# Patient Record
Sex: Male | Born: 1975 | Race: White | Hispanic: No | Marital: Married | Smoking: Never smoker
Health system: Southern US, Community
[De-identification: ages and names within clinical notes are randomized; demographics above are authoritative.]

## PROBLEM LIST (undated history)

## (undated) DIAGNOSIS — B019 Varicella without complication: Secondary | ICD-10-CM

## (undated) DIAGNOSIS — H00013 Hordeolum externum right eye, unspecified eyelid: Secondary | ICD-10-CM

## (undated) DIAGNOSIS — I7300 Raynaud's syndrome without gangrene: Secondary | ICD-10-CM

## (undated) DIAGNOSIS — E785 Hyperlipidemia, unspecified: Secondary | ICD-10-CM

## (undated) DIAGNOSIS — T7840XA Allergy, unspecified, initial encounter: Secondary | ICD-10-CM

## (undated) HISTORY — DX: Raynaud's syndrome without gangrene: I73.00

## (undated) HISTORY — DX: Varicella without complication: B01.9

## (undated) HISTORY — DX: Hordeolum externum right eye, unspecified eyelid: H00.013

## (undated) HISTORY — PX: WISDOM TOOTH EXTRACTION: SHX21

## (undated) HISTORY — DX: Hyperlipidemia, unspecified: E78.5

## (undated) HISTORY — DX: Allergy, unspecified, initial encounter: T78.40XA

---

## 2009-04-11 HISTORY — PX: VASECTOMY: SHX75

## 2015-07-13 DIAGNOSIS — M19012 Primary osteoarthritis, left shoulder: Secondary | ICD-10-CM | POA: Diagnosis not present

## 2015-07-13 DIAGNOSIS — M7542 Impingement syndrome of left shoulder: Secondary | ICD-10-CM | POA: Diagnosis not present

## 2015-07-13 DIAGNOSIS — M25512 Pain in left shoulder: Secondary | ICD-10-CM | POA: Diagnosis not present

## 2015-08-28 DIAGNOSIS — L720 Epidermal cyst: Secondary | ICD-10-CM | POA: Diagnosis not present

## 2015-09-15 DIAGNOSIS — D485 Neoplasm of uncertain behavior of skin: Secondary | ICD-10-CM | POA: Diagnosis not present

## 2015-09-15 DIAGNOSIS — L720 Epidermal cyst: Secondary | ICD-10-CM | POA: Diagnosis not present

## 2015-12-29 ENCOUNTER — Encounter: Payer: Self-pay | Admitting: Family Medicine

## 2015-12-29 ENCOUNTER — Ambulatory Visit (INDEPENDENT_AMBULATORY_CARE_PROVIDER_SITE_OTHER): Payer: BLUE CROSS/BLUE SHIELD | Admitting: Family Medicine

## 2015-12-29 ENCOUNTER — Telehealth: Payer: Self-pay | Admitting: Family Medicine

## 2015-12-29 VITALS — BP 138/88 | HR 61 | Temp 97.8°F | Resp 20 | Ht 71.0 in | Wt 175.0 lb

## 2015-12-29 DIAGNOSIS — Z130 Encounter for screening for diseases of the blood and blood-forming organs and certain disorders involving the immune mechanism: Secondary | ICD-10-CM | POA: Diagnosis not present

## 2015-12-29 DIAGNOSIS — E785 Hyperlipidemia, unspecified: Secondary | ICD-10-CM | POA: Insufficient documentation

## 2015-12-29 DIAGNOSIS — Z131 Encounter for screening for diabetes mellitus: Secondary | ICD-10-CM | POA: Diagnosis not present

## 2015-12-29 DIAGNOSIS — Z0000 Encounter for general adult medical examination without abnormal findings: Secondary | ICD-10-CM | POA: Diagnosis not present

## 2015-12-29 DIAGNOSIS — Z7189 Other specified counseling: Secondary | ICD-10-CM | POA: Diagnosis not present

## 2015-12-29 DIAGNOSIS — Z13220 Encounter for screening for lipoid disorders: Secondary | ICD-10-CM

## 2015-12-29 DIAGNOSIS — Z7689 Persons encountering health services in other specified circumstances: Secondary | ICD-10-CM

## 2015-12-29 LAB — COMPREHENSIVE METABOLIC PANEL WITH GFR
ALT: 18 U/L (ref 0–53)
AST: 16 U/L (ref 0–37)
Albumin: 4.4 g/dL (ref 3.5–5.2)
Alkaline Phosphatase: 44 U/L (ref 39–117)
BUN: 11 mg/dL (ref 6–23)
CO2: 32 meq/L (ref 19–32)
Calcium: 9.3 mg/dL (ref 8.4–10.5)
Chloride: 103 meq/L (ref 96–112)
Creatinine, Ser: 0.83 mg/dL (ref 0.40–1.50)
GFR: 108.57 mL/min
Glucose, Bld: 85 mg/dL (ref 70–99)
Potassium: 4.4 meq/L (ref 3.5–5.1)
Sodium: 140 meq/L (ref 135–145)
Total Bilirubin: 0.7 mg/dL (ref 0.2–1.2)
Total Protein: 7.2 g/dL (ref 6.0–8.3)

## 2015-12-29 LAB — LIPID PANEL
Cholesterol: 219 mg/dL — ABNORMAL HIGH (ref 0–200)
HDL: 56 mg/dL
LDL Cholesterol: 152 mg/dL — ABNORMAL HIGH (ref 0–99)
NonHDL: 162.85
Total CHOL/HDL Ratio: 4
Triglycerides: 55 mg/dL (ref 0.0–149.0)
VLDL: 11 mg/dL (ref 0.0–40.0)

## 2015-12-29 LAB — CBC WITH DIFFERENTIAL/PLATELET
BASOS PCT: 0.6 % (ref 0.0–3.0)
Basophils Absolute: 0 10*3/uL (ref 0.0–0.1)
EOS ABS: 0.1 10*3/uL (ref 0.0–0.7)
Eosinophils Relative: 1.6 % (ref 0.0–5.0)
HEMATOCRIT: 44.6 % (ref 39.0–52.0)
Hemoglobin: 15.2 g/dL (ref 13.0–17.0)
LYMPHS ABS: 1.7 10*3/uL (ref 0.7–4.0)
Lymphocytes Relative: 28.4 % (ref 12.0–46.0)
MCHC: 34 g/dL (ref 30.0–36.0)
MCV: 94.9 fl (ref 78.0–100.0)
MONO ABS: 0.5 10*3/uL (ref 0.1–1.0)
Monocytes Relative: 8.8 % (ref 3.0–12.0)
NEUTROS ABS: 3.7 10*3/uL (ref 1.4–7.7)
Neutrophils Relative %: 60.6 % (ref 43.0–77.0)
PLATELETS: 241 10*3/uL (ref 150.0–400.0)
RBC: 4.7 Mil/uL (ref 4.22–5.81)
RDW: 12.5 % (ref 11.5–15.5)
WBC: 6.1 10*3/uL (ref 4.0–10.5)

## 2015-12-29 LAB — HEMOGLOBIN A1C: Hgb A1c MFr Bld: 5.3 % (ref 4.6–6.5)

## 2015-12-29 NOTE — Progress Notes (Signed)
Patient ID: Craig Burton, male  DOB: [birth year], 40 y.o.   MRN: [id number] Patient Care Team    Relationship Specialty Notifications Start End  Ma Hillock, DO PCP - General Family Medicine  12/29/15     Subjective:  Craig Burton is a 40 y.o.  male present for new patient establishment. All past medical history, surgical history, allergies, family history, immunizations, medications and social history were obtained in the electronic medical record today. All recent labs, ED visits and hospitalizations within the last year were reviewed.  Health maintenance:  Colonoscopy: No fhx, screen at 49 Immunizations: tdap 2013, Influenza declined (encouraged yearly) Infectious disease screening: unknown, awaiting records.  PSA: No results found for: PSA . No Fhx. Screening discussion at 50-55.  Assistive device: no Oxygen use: no  Patient has a Dental home. Hospitalizations/ED visits: No Last Eye exam: [facility] [place]. 2016. Fhx glaucoma.  Immunization History  Administered Date(s) Administered  . Tdap 04/12/2011     Past Medical History:  Diagnosis Date  . Allergy   . Chicken pox    Allergies  Allergen Reactions  . Penicillins Hives   Past Surgical History:  Procedure Laterality Date  . VASECTOMY  2011   Family History  Problem Relation Age of Onset  . Arthritis Mother   . Diabetes Mother   . Prostate cancer Father   . Heart disease Father     MI- 69  . Glaucoma Father   . Arthritis Maternal Grandmother   . Diabetes Maternal Grandmother   . Stroke Maternal Grandmother     90  . Glaucoma Maternal Grandmother   . Diabetes Maternal Grandfather    Social History   Social History  . Marital status: Married    Spouse name: N/A  . Number of children: N/A  . Years of education: N/A   Occupational History  . Not on file.   Social History Main Topics  . Smoking status: Never Smoker  . Smokeless tobacco: Never Used  . Alcohol use 4.2 oz/week    7  Cans of beer per week     Comment: daily 1-2 drinks   . Drug use: No  . Sexual activity: Yes    Birth control/ protection: None   Other Topics Concern  . Not on file   Social History Narrative   Married to [place]). 2 Children (Will and [name]).    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    Feels safe in his relationships.      Medication List    as of 12/29/2015  5:16 PM   You have not been prescribed any medications.      Recent Results (from the past 2160 hour(s))  CBC w/Diff     Status: None   Collection Time: 12/29/15  9:52 AM  Result Value Ref Range   WBC 6.1 4.0 - 10.5 K/uL   RBC 4.70 4.22 - 5.81 Mil/uL   Hemoglobin 15.2 13.0 - 17.0 g/dL   HCT 44.6 39.0 - 52.0 %   MCV 94.9 78.0 - 100.0 fl   MCHC 34.0 30.0 - 36.0 g/dL   RDW 12.5 11.5 - 15.5 %   Platelets 241.0 150.0 - 400.0 K/uL   Neutrophils Relative % 60.6 43.0 - 77.0 %   Lymphocytes Relative 28.4 12.0 - 46.0 %   Monocytes Relative 8.8 3.0 - 12.0 %  Eosinophils Relative 1.6 0.0 - 5.0 %   Basophils Relative 0.6 0.0 - 3.0 %   Neutro Abs 3.7 1.4 - 7.7 K/uL   Lymphs Abs 1.7 0.7 - 4.0 K/uL   Monocytes Absolute 0.5 0.1 - 1.0 K/uL   Eosinophils Absolute 0.1 0.0 - 0.7 K/uL   Basophils Absolute 0.0 0.0 - 0.1 K/uL  Comp Met (CMET)     Status: None   Collection Time: 12/29/15  9:52 AM  Result Value Ref Range   Sodium 140 135 - 145 mEq/L   Potassium 4.4 3.5 - 5.1 mEq/L   Chloride 103 96 - 112 mEq/L   CO2 32 19 - 32 mEq/L   Glucose, Bld 85 70 - 99 mg/dL   BUN 11 6 - 23 mg/dL   Creatinine, Ser 0.83 0.40 - 1.50 mg/dL   Total Bilirubin 0.7 0.2 - 1.2 mg/dL   Alkaline Phosphatase 44 39 - 117 U/L   AST 16 0 - 37 U/L   ALT 18 0 - 53 U/L   Total Protein 7.2 6.0 - 8.3 g/dL   Albumin 4.4 3.5 - 5.2 g/dL   Calcium 9.3 8.4 - 10.5 mg/dL   GFR 108.57 >60.00 mL/min  HgB A1c     Status: None   Collection Time: 12/29/15  9:52 AM  Result  Value Ref Range   Hgb A1c MFr Bld 5.3 4.6 - 6.5 %    Comment: Glycemic Control Guidelines for People with Diabetes:Non Diabetic:  <6%Goal of Therapy: <7%Additional Action Suggested:  >8%   Lipid panel     Status: Abnormal   Collection Time: 12/29/15  9:52 AM  Result Value Ref Range   Cholesterol 219 (H) 0 - 200 mg/dL    Comment: ATP III Classification       Desirable:  < 200 mg/dL               Borderline High:  200 - 239 mg/dL          High:  > = 240 mg/dL   Triglycerides 55.0 0.0 - 149.0 mg/dL    Comment: Normal:  <150 mg/dLBorderline High:  150 - 199 mg/dL   HDL 56.00 >39.00 mg/dL   VLDL 11.0 0.0 - 40.0 mg/dL   LDL Cholesterol 152 (H) 0 - 99 mg/dL   Total CHOL/HDL Ratio 4     Comment:                Men          Women1/2 Average Risk     3.4          3.3Average Risk          5.0          4.42X Average Risk          9.6          7.13X Average Risk          15.0          11.0                       NonHDL 162.85     Comment: NOTE:  Non-HDL goal should be 30 mg/dL higher than patient's LDL goal (i.e. LDL goal of < 70 mg/dL, would have non-HDL goal of < 100 mg/dL)    Patient was never admitted.   ROS: 14 pt review of systems performed and negative (unless mentioned in an HPI)  Objective: BP 138/88 (BP Location: Right  Arm, Patient Position: Sitting, Cuff Size: Normal)   Pulse 61   Temp 97.8 F (36.6 C)   Resp 20   Ht _0  (1.803 m)   Wt 175 lb (79.4 kg)   SpO2 98%   BMI 24.41 kg/m  Gen: Afebrile. No acute distress. Nontoxic in appearance, well-developed, well-nourished,  Thin caucasian male.  HENT: AT. [place]. Bilateral TM visualized and normal in appearance, normal external auditory canal. MMM, no oral lesions, adequate dentition. Bilateral nares within normal limits. Throat without erythema, ulcerations or exudates. no Cough on exam, no hoarseness on exam. Eyes:Pupils Equal Round Reactive to light, Extraocular movements intact,  Conjunctiva without redness, discharge or  icterus. Neck/lymp/endocrine: Supple,no lymphadenopathy, no thyromegaly CV: RRR no murmur, no edema, +2/4 P posterior tibialis pulses.  Chest: CTAB, no wheeze, rhonchi or crackles. normal Respiratory effort. normal Air movement. Abd: Soft. falt. NTND. BS present. no Masses palpated. No hepatosplenomegaly. No rebound tenderness or guarding. Skin: no rashes, purpura or petechiae. Warm and well-perfused. Skin intact. Neuro/Msk:  Normal gait. PERLA. EOMi. Alert. Oriented x3.  Cranial nerves II through XII intact. Muscle strength 5/5 upper/lower extremity. DTRs equal bilaterally. Psych: Normal affect, dress and demeanor. Normal speech. Normal thought content and judgment.   Assessment/plan: Sultan Pargas is a 40 y.o. male present for establish with CPE/establishment of care.  Encounter for preventive health examination Patient was encouraged to exercise greater than 150 minutes a week. Patient was encouraged to choose a diet filled with fresh fruits and vegetables, and lean meats. AVS provided to patient today for education/recommendation on gender specific health and safety maintenance. Influenza declined today.  Tdap UTD. Elevated lipid/Screening cholesterol level - Comp Met (CMET) - Lipid panel Screening, anemia, deficiency, iron - CBC w/Diff Diabetes mellitus screening - HgB A1c - Lipid panel   Return in about 1 year (around 12/28/2016) for CPE.  Electronically signed by: Howard Pouch, DO [place]

## 2015-12-29 NOTE — Patient Instructions (Signed)

## 2015-12-29 NOTE — Telephone Encounter (Signed)
Please call pt: - his labs looked all normal with the exception of elevated cholesterol.  - with his fhx, I would suggest he start fish oil ~ 1- 2 g daily, eat a diet low in fructose/sugar and higher fiber and exercise > 150 minutes a week. We will monitor yearly.   Lipid Panel     Component Value Date/Time   CHOL 219 (H) 12/29/2015 0952   TRIG 55.0 12/29/2015 0952   HDL 56.00 12/29/2015 0952   CHOLHDL 4 12/29/2015 0952   VLDL 11.0 12/29/2015 0952   LDLCALC 152 (H) 12/29/2015 [id number]

## 2015-12-31 NOTE — Telephone Encounter (Signed)
Left detailed message with lab results and instructions on patient voice mail.

## 2016-01-18 ENCOUNTER — Encounter: Payer: Self-pay | Admitting: Family Medicine

## 2016-03-09 DIAGNOSIS — H5203 Hypermetropia, bilateral: Secondary | ICD-10-CM | POA: Diagnosis not present

## 2016-03-09 DIAGNOSIS — Z135 Encounter for screening for eye and ear disorders: Secondary | ICD-10-CM | POA: Diagnosis not present

## 2016-03-09 DIAGNOSIS — H52223 Regular astigmatism, bilateral: Secondary | ICD-10-CM | POA: Diagnosis not present

## 2016-04-11 DIAGNOSIS — I7300 Raynaud's syndrome without gangrene: Secondary | ICD-10-CM

## 2016-04-11 HISTORY — DX: Raynaud's syndrome without gangrene: I73.00

## 2016-07-05 ENCOUNTER — Other Ambulatory Visit: Payer: Self-pay | Admitting: Emergency Medicine

## 2016-07-05 ENCOUNTER — Ambulatory Visit (INDEPENDENT_AMBULATORY_CARE_PROVIDER_SITE_OTHER): Payer: BLUE CROSS/BLUE SHIELD | Admitting: Family Medicine

## 2016-07-05 ENCOUNTER — Ambulatory Visit (INDEPENDENT_AMBULATORY_CARE_PROVIDER_SITE_OTHER): Payer: Self-pay

## 2016-07-05 ENCOUNTER — Encounter: Payer: Self-pay | Admitting: Family Medicine

## 2016-07-05 ENCOUNTER — Ambulatory Visit: Payer: BLUE CROSS/BLUE SHIELD | Admitting: Family Medicine

## 2016-07-05 VITALS — BP 128/74 | HR 75 | Temp 98.0°F | Ht 71.0 in | Wt 170.8 lb

## 2016-07-05 DIAGNOSIS — R52 Pain, unspecified: Secondary | ICD-10-CM

## 2016-07-05 DIAGNOSIS — J069 Acute upper respiratory infection, unspecified: Secondary | ICD-10-CM

## 2016-07-05 DIAGNOSIS — B9789 Other viral agents as the cause of diseases classified elsewhere: Secondary | ICD-10-CM

## 2016-07-05 DIAGNOSIS — W57XXXA Bitten or stung by nonvenomous insect and other nonvenomous arthropods, initial encounter: Secondary | ICD-10-CM | POA: Diagnosis not present

## 2016-07-05 DIAGNOSIS — S70362A Insect bite (nonvenomous), left thigh, initial encounter: Secondary | ICD-10-CM | POA: Diagnosis not present

## 2016-07-05 DIAGNOSIS — M25532 Pain in left wrist: Secondary | ICD-10-CM

## 2016-07-05 MED ORDER — DOXYCYCLINE HYCLATE 100 MG PO TABS: 100.0000 mg | ORAL_TABLET | Freq: Two times a day (BID) | ORAL | 0 refills | Status: AC

## 2016-07-05 NOTE — Progress Notes (Signed)
Pre visit review using our clinic review tool, if applicable. No additional management support is needed unless otherwise documented below in the visit note. 

## 2016-07-05 NOTE — Progress Notes (Signed)
Shamus Desantis is a 41 y.o. male here for a new problem.  History of Present Illness:   Insurance claims handler, CMA, acting as scribe for Dr. Earlene Plater.  Chief Complaint  Patient presents with  . Acute Visit  . Tick Removal   HPI: Patient states he went on a field trip over the weekend with his daughter's school.  They stayed in [place]. On Sunday, he found a tick on his upper, inner left thigh.  He removed the tick fully.  The area is now red and swollen.  Not painful but does itch.  He states he had a low-grade fever on Monday night and felt fatigued as well.  States he also thinks he is developing a cold because he has had a dry cough and sinus-type headache. He has taken Tylenol for treatment with mild relief.   PMHx, SurgHx, SocialHx, Medications, and Allergies were reviewed in the Visit Navigator and updated as appropriate.  Current Medications:   .  None  Review of Systems:   Review of Systems  Constitutional: Positive for chills, fever and malaise/fatigue.  HENT: Negative for congestion.   Eyes: Negative for blurred vision.  Respiratory: Positive for cough. Negative for shortness of breath.   Cardiovascular: Negative for chest pain and palpitations.  Gastrointestinal: Negative for abdominal pain, nausea and vomiting.  Genitourinary: Negative for frequency.  Musculoskeletal: Negative for back pain.  Skin: Positive for itching.  Neurological: Positive for headaches. Negative for loss of consciousness.    Vitals:   Vitals:   07/05/16 1313  BP: 128/74  Pulse: 75  Temp: 98 F (36.7 C)  TempSrc: Oral  SpO2: 98%  Weight: 170 lb 12.8 oz (77.5 kg)  Height: 5\' 11"  (1.803 m)     Body mass index is 23.82 kg/m.  Physical Exam:   Physical Exam  Constitutional: He is oriented to person, place, and time. He appears well-developed and well-nourished. No distress.  HENT:  Head: Normocephalic and atraumatic.  Right Ear: External ear normal.  Left Ear: External ear normal.  Nose: Nose  normal.  Mouth/Throat: Oropharynx is clear and moist.  Eyes: Conjunctivae and EOM are normal. Pupils are equal, round, and reactive to light.  Neck: Normal range of motion. Neck supple.  Cardiovascular: Normal rate, regular rhythm, normal heart sounds and intact distal pulses.   Pulmonary/Chest: Effort normal and breath sounds normal.  Abdominal: Soft. Bowel sounds are normal.  Musculoskeletal: Normal range of motion.  Neurological: He is alert and oriented to person, place, and time.  Skin:     Psychiatric: He has a normal mood and affect. His behavior is normal. Judgment and thought content normal.  Nursing note and vitals reviewed.   Assessment and Plan:    Marquis was seen today for acute visit and tick removal.  Diagnoses and all orders for this visit:  Tick bite of left thigh, initial encounter Comments: Unlikely to be the cause of RMSF. Not concerned for Lyme. Rx Abx safety net.  Orders: -     doxycycline (VIBRA-TABS) 100 MG tablet; Take 1 tablet (100 mg total) by mouth 2 (two) times daily.  Viral upper respiratory tract infection Comments: Symptomatic care reviewed.   . Reviewed expectations re: course of current medical issues. . Discussed self-management of symptoms. . Outlined signs and symptoms indicating need for more acute intervention. . Patient verbalized understanding and all questions were answered. . See orders for this visit as documented in the electronic medical record. . Patient received an After-Visit Summary.  CMA  served as Neurosurgeonscribe during this visit. History, Physical, and Plan performed by medical provider. Documentation and orders reviewed and attested to. Helane RimaErica Dalaysia Harms, D.O.  Helane RimaErica Sofi Bryars, D.O.

## 2016-12-30 ENCOUNTER — Encounter: Payer: BLUE CROSS/BLUE SHIELD | Admitting: Family Medicine

## 2017-01-06 ENCOUNTER — Encounter: Payer: Self-pay | Admitting: Family Medicine

## 2017-01-06 ENCOUNTER — Ambulatory Visit (INDEPENDENT_AMBULATORY_CARE_PROVIDER_SITE_OTHER): Payer: BLUE CROSS/BLUE SHIELD | Admitting: Family Medicine

## 2017-01-06 VITALS — BP 125/86 | HR 60 | Temp 98.0°F | Resp 20 | Ht 71.0 in | Wt 172.0 lb

## 2017-01-06 DIAGNOSIS — E785 Hyperlipidemia, unspecified: Secondary | ICD-10-CM

## 2017-01-06 DIAGNOSIS — Z0000 Encounter for general adult medical examination without abnormal findings: Secondary | ICD-10-CM

## 2017-01-06 DIAGNOSIS — Z131 Encounter for screening for diabetes mellitus: Secondary | ICD-10-CM | POA: Diagnosis not present

## 2017-01-06 DIAGNOSIS — Z130 Encounter for screening for diseases of the blood and blood-forming organs and certain disorders involving the immune mechanism: Secondary | ICD-10-CM

## 2017-01-06 DIAGNOSIS — Z2821 Immunization not carried out because of patient refusal: Secondary | ICD-10-CM | POA: Diagnosis not present

## 2017-01-06 LAB — COMPREHENSIVE METABOLIC PANEL
ALT: 18 U/L (ref 0–53)
AST: 16 U/L (ref 0–37)
Albumin: 4.6 g/dL (ref 3.5–5.2)
Alkaline Phosphatase: 45 U/L (ref 39–117)
BUN: 13 mg/dL (ref 6–23)
CO2: 32 meq/L (ref 19–32)
CREATININE: 0.81 mg/dL (ref 0.40–1.50)
Calcium: 9.9 mg/dL (ref 8.4–10.5)
Chloride: 100 mEq/L (ref 96–112)
GFR: 111.11 mL/min (ref >60.00)
GLUCOSE: 90 mg/dL (ref 70–99)
Potassium: 4.8 mEq/L (ref 3.5–5.1)
SODIUM: 137 meq/L (ref 135–145)
TOTAL PROTEIN: 7.2 g/dL (ref 6.0–8.3)
Total Bilirubin: 0.5 mg/dL (ref 0.2–1.2)

## 2017-01-06 LAB — LIPID PANEL
CHOLESTEROL: 216 mg/dL — AB (ref 0–200)
HDL: 52.3 mg/dL (ref >39.00)
LDL Cholesterol: 151 mg/dL — ABNORMAL HIGH (ref 0–99)
NonHDL: 164.06
TRIGLYCERIDES: 65 mg/dL (ref 0.0–149.0)
Total CHOL/HDL Ratio: 4
VLDL: 13 mg/dL (ref 0.0–40.0)

## 2017-01-06 LAB — CBC WITH DIFFERENTIAL/PLATELET
BASOS ABS: 0.1 10*3/uL (ref 0.0–0.1)
BASOS PCT: 1.2 % (ref 0.0–3.0)
EOS ABS: 0.1 10*3/uL (ref 0.0–0.7)
Eosinophils Relative: 1.5 % (ref 0.0–5.0)
HCT: 46.8 % (ref 39.0–52.0)
Hemoglobin: 15.2 g/dL (ref 13.0–17.0)
Lymphocytes Relative: 35.6 % (ref 12.0–46.0)
Lymphs Abs: 1.8 10*3/uL (ref 0.7–4.0)
MCHC: 32.6 g/dL (ref 30.0–36.0)
MCV: 98.2 fl (ref 78.0–100.0)
MONO ABS: 0.5 10*3/uL (ref 0.1–1.0)
Monocytes Relative: 9.1 % (ref 3.0–12.0)
NEUTROS ABS: 2.7 10*3/uL (ref 1.4–7.7)
Neutrophils Relative %: 52.6 % (ref 43.0–77.0)
PLATELETS: 271 10*3/uL (ref 150.0–400.0)
RBC: 4.77 Mil/uL (ref 4.22–5.81)
RDW: 13.1 % (ref 11.5–15.5)
WBC: 5.1 10*3/uL (ref 4.0–10.5)

## 2017-01-06 LAB — HEMOGLOBIN A1C: Hgb A1c MFr Bld: 5.1 % (ref 4.6–6.5)

## 2017-01-06 NOTE — Progress Notes (Signed)
Patient ID: Craig Burton, male  DOB: Dec 26, 1975, 41 y.o.   MRN: [id number] Patient Care Team    Relationship Specialty Notifications Start End  Ma Hillock, DO PCP - General Family Medicine  12/29/15     Subjective:  Craig Burton is a 41 y.o.  male present for new patient establishment. All past medical history, surgical history, allergies, family history, immunizations, medications and social history were obtained in the electronic medical record today. All recent labs, ED visits and hospitalizations within the last year were reviewed.  Health maintenance: Updated 01/06/2017 Colonoscopy: No fhx, screen at 44 Immunizations: tdap 2013, Influenza declined (encouraged yearly) Infectious disease screening: declined today. PSA: No results found for: PSA . No Fhx. Screening discussion at 50-55.  Assistive device: no Oxygen use: no Patient has a Dental home. Routine visits Hospitalizations/ED visits: no Last Eye exam: [facility] in [place]. Fhx glaucoma.  Immunization History  Administered Date(s) Administered  . Tdap 04/12/2011     Past Medical History:  Diagnosis Date  . Allergy   . Chicken pox   . Hordeolum externum of right eye   . Hyperlipidemia    Allergies  Allergen Reactions  . Penicillins Hives   Past Surgical History:  Procedure Laterality Date  . VASECTOMY  2011  . WISDOM TOOTH EXTRACTION     Family History  Problem Relation Age of Onset  . Arthritis Mother   . Diabetes Mother   . Prostate cancer Father   . Heart disease Father        MI- 58  . Glaucoma Father   . Arthritis Maternal Grandmother   . Diabetes Maternal Grandmother   . Stroke Maternal Grandmother        90  . Glaucoma Maternal Grandmother   . Diabetes Maternal Grandfather    Social History   Social History  . Marital status: Married    Spouse name: N/A  . Number of children: N/A  . Years of education: N/A   Occupational History  . Not on file.   Social History Main  Topics  . Smoking status: Never Smoker  . Smokeless tobacco: Never Used  . Alcohol use 4.2 oz/week    7 Cans of beer per week     Comment: daily 1-2 drinks   . Drug use: No  . Sexual activity: Yes    Birth control/ protection: None   Other Topics Concern  . Not on file   Social History Narrative   Married to [place]). 2 Children (Will and [name]).    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    Feels safe in his relationships.    Allergies as of 01/06/2017      Reactions   Penicillins Hives      Medication List    as of 01/06/2017  9:02 AM   You have not been prescribed any medications.          Discharge Care Instructions        Start     Ordered   01/06/17 0000  Comp Met (CMET)     01/06/17 0854   01/06/17 0000  CBC w/Diff     01/06/17 0854   01/06/17 0000  Lipid panel     01/06/17 0854   01/06/17 0000  HgB A1c     01/06/17 0854       No  results found for this or any previous visit (from the past 2160 hour(s)).  Patient was never admitted.   ROS: 14 pt review of systems performed and negative (unless mentioned in an HPI)  Objective: BP 125/86 (BP Location: Left Arm, Patient Position: Sitting, Cuff Size: Large)   Pulse 60   Temp 98 F (36.7 C)   Resp 20   Ht '5\' 11"'$  (1.803 m)   Wt 172 lb (78 kg)   SpO2 98%   BMI 23.99 kg/m  Gen: Afebrile. No acute distress. Nontoxic in appearance, well developed, well nourished, pleasant caucasian male. HENT: AT. [place]. Bilateral TM visualized and normal in appearance. MMM. Bilateral nares without erythema or swelling. Throat without erythema or exudates. No cough, no hoarseness.  Eyes:Pupils Equal Round Reactive to light, Extraocular movements intact,  Conjunctiva without redness, discharge or icterus. Neck/lymp/endocrine: Supple,no lymphadenopathy, no thyromegaly CV: RRR no murmur, no edema, +2/4 P posterior tibialis  pulses Chest: CTAB, no wheeze or crackles Abd: Soft. flat. NTND. BS present. no Masses palpated.  MSK: no erythema, swelling or deformities. NROM. NV intact. Skin: no rashes, purpura or petechiae.  Neuro:  Normal gait. PERLA. EOMi. Alert. Oriented x3. Cranial nerves II through XII intact. Muscle strength 5/5 U/Lextremity. DTRs equal bilaterally. Psych: Normal affect, dress and demeanor. Normal speech. Normal thought content and judgment.  Assessment/plan: Craig Burton is a 41 y.o. male present for establish with CPE/establishment of care.  Encounter for preventive health examination Patient was encouraged to exercise greater than 150 minutes a week. Patient was encouraged to choose a diet filled with fresh fruits and vegetables, and lean meats. AVS provided to patient today for education/recommendation on gender specific health and safety maintenance. Declined flu, other immunizations UTD.  Declined HIV testing.  Elevated lipids - Recommended low saturated fat, higher fiber diet. Plenty of exercise. Start  Fish oil (dose will be discussed after lab results) - Fhx heart disease is present - Comp Met (CMET) - Lipid panel Screening for diabetes mellitus - HgB A1c Screening for iron deficiency anemia - CBC immunization not carried out because of patient refusal - declined flu shot.    Return in about 1 year (around 01/06/2018) for CPE.  Electronically signed by: Howard Pouch, DO [place]

## 2017-01-06 NOTE — Patient Instructions (Signed)

## 2017-03-22 ENCOUNTER — Telehealth: Payer: Self-pay | Admitting: Medical

## 2017-03-22 ENCOUNTER — Ambulatory Visit (HOSPITAL_BASED_OUTPATIENT_CLINIC_OR_DEPARTMENT_OTHER)
Admission: RE | Admit: 2017-03-22 | Discharge: 2017-03-22 | Disposition: A | Discharge status: Home / Self Care | Payer: BLUE CROSS/BLUE SHIELD | Source: Ambulatory Visit | Attending: Medical | Admitting: Medical

## 2017-03-22 ENCOUNTER — Encounter: Payer: Self-pay | Admitting: Medical

## 2017-03-22 ENCOUNTER — Ambulatory Visit: Payer: BLUE CROSS/BLUE SHIELD | Admitting: Medical

## 2017-03-22 VITALS — BP 117/68 | HR 67 | Temp 97.8°F | Ht 71.0 in | Wt 165.0 lb

## 2017-03-22 DIAGNOSIS — M25522 Pain in left elbow: Secondary | ICD-10-CM | POA: Diagnosis not present

## 2017-03-22 DIAGNOSIS — W19XXXA Unspecified fall, initial encounter: Secondary | ICD-10-CM | POA: Insufficient documentation

## 2017-03-22 DIAGNOSIS — S2242XA Multiple fractures of ribs, left side, initial encounter for closed fracture: Secondary | ICD-10-CM

## 2017-03-22 DIAGNOSIS — R0781 Pleurodynia: Secondary | ICD-10-CM | POA: Diagnosis not present

## 2017-03-22 DIAGNOSIS — J939 Pneumothorax, unspecified: Secondary | ICD-10-CM | POA: Insufficient documentation

## 2017-03-22 DIAGNOSIS — S270XXA Traumatic pneumothorax, initial encounter: Secondary | ICD-10-CM

## 2017-03-22 MED ORDER — KETOROLAC TROMETHAMINE 60 MG/2ML IM SOLN
60.0000 mg | Freq: Once | INTRAMUSCULAR | Status: AC
Start: 2017-03-22 — End: 2017-03-22
  Administered 2017-03-22: 60 mg via INTRAMUSCULAR

## 2017-03-22 MED ORDER — HYDROCODONE-ACETAMINOPHEN 5-325 MG PO TABS: 1.0000 | ORAL_TABLET | Freq: Four times a day (QID) | ORAL | 0 refills | Status: DC | PRN

## 2017-03-22 MED ORDER — DICLOFENAC SODIUM 75 MG PO TBEC: 75.0000 mg | DELAYED_RELEASE_TABLET | Freq: Two times a day (BID) | ORAL | 0 refills | Status: DC

## 2017-03-22 NOTE — Progress Notes (Signed)
Subjective:    Patient ID: Craig Burton, male    DOB: 1975-08-29, 41 y.o.   MRN: [id number]  HPI  Pt fell on steps inside his home. Pt landed on left side of his body. States he was fell down steps and slid down. Has severe pain on deep breathing and moving.   Some pain in his left elbow. Pt does have pain on range of motion.  No neck pain, no shoulder pain, no humerus pain or forearm pain.  No scapular pain.  No loc at time of fall.  Pt is rt handed.  He does not report any shortness of breath.  He states the ribs at rest.    Review of Systems  Constitutional: Negative for chills, fatigue and fever.  Respiratory: Negative for cough, chest tightness and wheezing.   Cardiovascular: Negative for chest pain and palpitations.  Gastrointestinal: Negative for abdominal pain, blood in stool, constipation, diarrhea and vomiting.  Musculoskeletal: Negative for back pain, gait problem and neck pain.       Rib pain and elbow pain  Skin: Negative for rash.  Neurological: Negative for dizziness, seizures, weakness and light-headedness.  Hematological: Negative for adenopathy. Does not bruise/bleed easily.  Psychiatric/Behavioral: Negative for behavioral problems, confusion, hallucinations, sleep disturbance and suicidal ideas. The patient is not nervous/anxious.    Past Medical History:  Diagnosis Date  . Allergy   . Chicken pox   . Hordeolum externum of right eye   . Hyperlipidemia      Social History   Socioeconomic History  . Marital status: Married    Spouse name: Not on file  . Number of children: Not on file  . Years of education: Not on file  . Highest education level: Not on file  Social Needs  . Financial resource strain: Not on file  . Food insecurity - worry: Not on file  . Food insecurity - inability: Not on file  . Transportation needs - medical: Not on file  . Transportation needs - non-medical: Not on file  Occupational History  . Not on file  Tobacco Use    . Smoking status: Never Smoker  . Smokeless tobacco: Never Used  Substance and Sexual Activity  . Alcohol use: Yes    Alcohol/week: 4.2 oz    Types: 7 Cans of beer per week    Comment: daily 1-2 drinks   . Drug use: No  . Sexual activity: Yes    Birth control/protection: None  Other Topics Concern  . Not on file  Social History Narrative   Married to Craig Burton ([place]). 2 Children (Craig Burton and Craig Burton).    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    Feels safe in his relationships.     Past Surgical History:  Procedure Laterality Date  . VASECTOMY  2011  . WISDOM TOOTH EXTRACTION      Family History  Problem Relation Age of Onset  . Arthritis Mother   . Diabetes Mother   . Prostate cancer Father   . Heart disease Father        MI- [id number]  . Glaucoma Father   . Arthritis Maternal Grandmother   . Diabetes Maternal Grandmother   . Stroke Maternal Grandmother        90  . Glaucoma Maternal Grandmother   . Diabetes Maternal Grandfather     Allergies  Allergen Reactions  . Penicillins Hives  No current outpatient medications on file prior to visit.   No current facility-administered medications on file prior to visit.     BP 117/68   Pulse 67   Temp 97.8 F (36.6 C) (Oral)   Ht 5\' 11"  (1.803 m)   Wt 165 lb (74.8 kg)   BMI 23.01 kg/m       Objective:   Physical Exam  General Mental Status- Alert. General Appearance- Not in acute distress.   Skin General: Color- Normal Color. Moisture- Normal Moisture.  Neck Carotid Arteries- Normal color. Moisture- Normal Moisture. No carotid bruits. No JVD.  No trachea midline. No mid c spine tenderness.   Chest and Lung Exam Auscultation: Breath Sounds:-Normal.  Obvious pain on deep inspiration.  Clear, even and unlabored  Cardiovascular Auscultation:Rythm- Regular. Murmurs & Other Heart Sounds:Auscultation of the heart reveals- No  Murmurs.  Lt shoulder- good rom, no pain on palpation of shoulder. Lt humerus- no pain on palpation of humerus. Left elbow- mild-moderate direct tenderness to palpation. No swelling. Pain on flexion and extension. Left side ribs- upper mid clavicular ribs very tender to mild palpation. Bruise seen. Pain over all ribs at same level  Clavicles- no tenderness to palpation. Left scapula- not tender to palpation.   Neurologic Cranial Nerve exam:- CN III-XII intact(No nystagmus), symmetric smile. Strength:- 5/5 equal and symmetric strength both upper and lower extremities.      Assessment & Plan:  For  left-sided rib pain and left elbow pain we Craig Burton get x-rays of both areas.  We Craig Burton let you know those results as they come in.  Toradol 60 mg IM injection given today.  Can start diclofenac tomorrow around 11 AM.  Prescription of Norco given for breakthrough.(Rx advisement given.)  If rib fracture seen can expect pain to last 6-8 weeks.  We would taper down on the medication given for the pain.  If no fracture seen in ribs but high level pain persists in 1-2 weeks then consider repeat x-ray to see small early fracture not visible today.  For elbow pain, if no fracture seen Craig Burton provide arm sling to use when not working.  But if pain still present in a week would recommend repeat x-ray.   If fracture seen today on elbow Craig Burton refer you to sports medicine or orthopedist.  Follow-up in 7-10 days or as needed.  I did get a call from the radiologist today and he described 2 rib fractures with very small less than 5% pneumothorax.  He recommended watchful waiting with follow-ups.  I did send a message to PCP today notifying her of patient's fall and subsequent injuries.  Craig Burton try carbon copy the imaging results as well.  I did talk with patient today and notified him of the findings.  Patient states that he was doing fine still only having pain when he breathes deep or moved his thorax.  No  shortness of breath.  I advised him would notify his PCP.  We Craig Burton see if he is to follow-up with her or with me.  I explained to him that follow-up with me on Friday might be better as we could repeat chest x-ray if needed.  Also I did explain to him that if he has any shortness of breath then be seen in the emergency department.  Did educate him on watchful waiting approach in light of the small percentage.  Patient expressed understanding    Efstathios Sawin, Ramon DredgeEdward, PA-C

## 2017-03-22 NOTE — Telephone Encounter (Signed)
I did talk with patient and informed him of following up on Friday.  I gave him choice to follow-up with his PCP or myself.  He went ahead and agreed to see me on Friday at 2:15 PM.  That would probably be more convenient for him since we can repeat the x-ray in our building.  Will you go ahead and give him an appointment for 2:15 on Friday.  I asked him to call since I do not know how to schedule people.  But would you go ahead and schedule in case she does not call you.  I do not want his slot to be taken.

## 2017-03-22 NOTE — Patient Instructions (Addendum)
For  left-sided rib pain and left elbow pain we will get x-rays of both areas.  We will let you know those results as they come in.  Toradol 60 mg IM injection given today.  Can start diclofenac tomorrow around 11 AM.  Prescription of Norco given for breakthrough.(Rx advisement given.)  If rib fracture seen can expect pain to last 6-8 weeks.  We would taper down on the medication given for the pain.  If no fracture seen in ribs but high level pain persists in 1-2 weeks then consider repeat x-ray to see small early fracture not visible today.  For elbow pain, if no fracture seen will provide arm sling to use when not working.  But if pain still present in a week would recommend repeat x-ray.   If fracture seen today on elbow will refer you to sports medicine or orthopedist.  Follow-up in 7-10 days or as needed.

## 2017-03-22 NOTE — Telephone Encounter (Signed)
Dr. Claiborne BillingsKuneff,  I saw a patient of yours this morning with history of falling  downstairs and subsequent moderate to severe left rib pain when he breathes and when he moves his thorax.  I did x-rays which showed the below findings.  I did talk with the radiologist in on the phone he estimated less than 5% pneumothorax.  He recommended watchful waiting.  I wanted your advice on how to proceed.  If you would like need to follow-up I could see him on Friday and repeat chest x-ray.  Or would you rather see him.  We do have imaging at the med center so may be more convenient to follow-up with myself.  Patient's oxygen was 97% on exam.  No tracheal deviation and no labored breathing.  So I did not think pulmonology or ED necessary.  But just wanted to make you aware.  Might try to call you shortly in your office as well.  Below is the impression summary.   Thanks,  Catera Hankins, Ramon DredgeEdward, PA-C  Displaced lateral left fifth rib fracture. Nondisplaced left sixth rib fracture. Rather minimal pneumothorax on the left without tension component. No edema or consolidation.

## 2017-03-22 NOTE — Telephone Encounter (Deleted)
Craig Burton,  Wanted you to review  this visit note for patient I saw of yours today.  With her complaints I am beginning with workup for fever/FUO.  I wanted to make you aware of recent changes to her Effexor dose and the fact that she is on tramadol.  If I am not mistaken, typically there is some serotonin syndrome potential?  This change was done by psychiatrist and patient's neurosurgeon.  Patient appears stable presently in terms of her mental status/psychiatric state.  I was wondering if you would be in agreement to lower her Effexor 150 mg daily and continue the tramadol. Thanks, Ramon DredgeEdward

## 2017-03-22 NOTE — Telephone Encounter (Addendum)
Thank you. If patient already has follow up with you that is ok. If he desires to come here, that is ok as well. I would just order xray after his exam. Either way, I agree he should be seen Friday.  Thank you.

## 2017-03-23 ENCOUNTER — Telehealth: Payer: Self-pay | Admitting: Medical

## 2017-03-23 NOTE — Telephone Encounter (Signed)
Craig DredgeEdward, I believe this is incorrect patient.  I responded to your note elsewhere. thanks

## 2017-03-23 NOTE — Telephone Encounter (Signed)
Pt is scheduled with you on Friday 03-24-2017 at 2:15. Done.

## 2017-03-23 NOTE — Telephone Encounter (Signed)
I tried to delete the note portion regarding fever of unknown origin.  This was on the wrong patient's chart.

## 2017-03-23 NOTE — Telephone Encounter (Signed)
Note the documentation in patient chart is incorrect. That was accidentally place in wrong chart.

## 2017-03-23 NOTE — Telephone Encounter (Signed)
Open to review to tried to delete erroneous entry

## 2017-03-24 ENCOUNTER — Ambulatory Visit: Payer: BLUE CROSS/BLUE SHIELD | Admitting: Medical

## 2017-03-24 ENCOUNTER — Encounter: Payer: Self-pay | Admitting: Medical

## 2017-03-24 ENCOUNTER — Ambulatory Visit (HOSPITAL_BASED_OUTPATIENT_CLINIC_OR_DEPARTMENT_OTHER)
Admission: RE | Admit: 2017-03-24 | Discharge: 2017-03-24 | Disposition: A | Discharge status: Home / Self Care | Payer: BLUE CROSS/BLUE SHIELD | Source: Ambulatory Visit | Attending: Medical | Admitting: Medical

## 2017-03-24 VITALS — BP 125/79 | HR 69 | Temp 97.9°F | Resp 16 | Wt 165.2 lb

## 2017-03-24 DIAGNOSIS — R918 Other nonspecific abnormal finding of lung field: Secondary | ICD-10-CM | POA: Diagnosis not present

## 2017-03-24 DIAGNOSIS — S270XXD Traumatic pneumothorax, subsequent encounter: Secondary | ICD-10-CM | POA: Diagnosis not present

## 2017-03-24 DIAGNOSIS — X58XXXA Exposure to other specified factors, initial encounter: Secondary | ICD-10-CM | POA: Insufficient documentation

## 2017-03-24 DIAGNOSIS — S2242XA Multiple fractures of ribs, left side, initial encounter for closed fracture: Secondary | ICD-10-CM

## 2017-03-24 NOTE — Progress Notes (Signed)
Subjective:    Patient ID: Craig Burton, male    DOB: [birth year], 41 y.o.   MRN: [id number]  HPI  Pain is managable. Pt states pain getting in and out of car.  But if he is seated and not moving he really does not have much pain at all.  He can do work remotely from home on his computer.  If he  talks long time will feel mild sob.  But does not describe any labored type breathing.  Pt has not taken pain medication. Only taking nsaid diclofenac   Known pneumothorax small percentage less than 5 % per radiologist.  Left elbow feels fine.   Can breath deeper today with no pain.  Review of Systems  Constitutional: Negative for chills, fatigue and fever.  HENT: Negative for congestion, ear discharge, facial swelling, mouth sores, nosebleeds and postnasal drip.   Respiratory: Negative for cough, chest tightness, shortness of breath and wheezing.        See hpi.  Cardiovascular: Negative for chest pain and palpitations.  Gastrointestinal: Negative for abdominal pain.  Genitourinary: Negative for dysuria.  Musculoskeletal: Negative for back pain.       Rib pain  Skin: Negative for rash.  Neurological: Negative for dizziness, light-headedness and headaches.  Hematological: Negative for adenopathy. Does not bruise/bleed easily.  Psychiatric/Behavioral: Negative for behavioral problems and confusion.   Past Medical History:  Diagnosis Date  . Allergy   . Chicken pox   . Hordeolum externum of right eye   . Hyperlipidemia      Social History   Socioeconomic History  . Marital status: Married    Spouse name: Not on file  . Number of children: Not on file  . Years of education: Not on file  . Highest education level: Not on file  Social Needs  . Financial resource strain: Not on file  . Food insecurity - worry: Not on file  . Food insecurity - inability: Not on file  . Transportation needs - medical: Not on file  . Transportation needs - non-medical: Not on file    Occupational History  . Not on file  Tobacco Use  . Smoking status: Never Smoker  . Smokeless tobacco: Never Used  Substance and Sexual Activity  . Alcohol use: Yes    Alcohol/week: 4.2 oz    Types: 7 Cans of beer per week    Comment: daily 1-2 drinks   . Drug use: No  . Sexual activity: Yes    Birth control/protection: None  Other Topics Concern  . Not on file  Social History Narrative   Married to [place] (Katie). 2 Children (Will and [name]).    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    Feels safe in his relationships.     Past Surgical History:  Procedure Laterality Date  . VASECTOMY  2011  . WISDOM TOOTH EXTRACTION      Family History  Problem Relation Age of Onset  . Arthritis Mother   . Diabetes Mother   . Prostate cancer Father   . Heart disease Father        MI- [id number]  . Glaucoma Father   . Arthritis Maternal Grandmother   . Diabetes Maternal Grandmother   . Stroke Maternal Grandmother        90  . Glaucoma Maternal Grandmother   . Diabetes Maternal Grandfather     Allergies  Allergen Reactions  . Penicillins Hives    Current Outpatient Medications on File Prior to Visit  Medication Sig Dispense Refill  . diclofenac (VOLTAREN) 75 MG EC tablet Take 1 tablet (75 mg total) by mouth 2 (two) times daily. 30 tablet 0  . HYDROcodone-acetaminophen (NORCO) 5-325 MG tablet Take 1 tablet by mouth every 6 (six) hours as needed for moderate pain. 20 tablet 0   No current facility-administered medications on file prior to visit.     BP 125/79   Pulse 69   Temp 97.9 F (36.6 C) (Oral)   Resp 16   Wt 165 lb 3.2 oz (74.9 kg)   SpO2 100%   BMI 23.04 kg/m       Objective:   Physical Exam   General- No acute distress. Pleasant patient. Neck- Full range of motion, no jvd trachea midline. Lungs- Clear, even and unlabored. Heart- regular rate and rhythm. Neurologic- CNII-  XII grossly intact. Left arm- in sling. No pain but when uses has less rib pain.      Assessment & Plan:  Will repeat xray today to assess size of pneumothorax. Make sure stable.  Elbow by your report does not need repeat xray.(I do agree with patient's assessment.)  Continue diclofenac. Use norco if needed for severe pain. So far no need to use.  Follow up date to be determined after xray. Will update Dr. Claiborne BillingsKuneff on your xray finding.  Craig Burton, Craig DredgeEdward, PA-C

## 2017-03-24 NOTE — Patient Instructions (Addendum)
Will repeat xray today to assess size of pneumothorax. Make sure stable.  Elbow by your report does not need repeat xray.(I do agree with patient's assessment)  Continue diclofenac. Use norco if needed for severe pain. So far no need to use.  Follow up date to be determined after xray. Will update Dr. Claiborne BillingsKuneff on your xray finding.

## 2017-04-14 ENCOUNTER — Ambulatory Visit: Payer: Self-pay | Admitting: Family Medicine

## 2017-04-21 ENCOUNTER — Encounter: Payer: Self-pay | Admitting: Family Medicine

## 2017-04-21 ENCOUNTER — Ambulatory Visit: Payer: BLUE CROSS/BLUE SHIELD | Admitting: Family Medicine

## 2017-04-21 VITALS — BP 110/71 | HR 67 | Temp 98.1°F | Wt 164.4 lb

## 2017-04-21 DIAGNOSIS — S2242XD Multiple fractures of ribs, left side, subsequent encounter for fracture with routine healing: Secondary | ICD-10-CM

## 2017-04-21 DIAGNOSIS — S270XXD Traumatic pneumothorax, subsequent encounter: Secondary | ICD-10-CM | POA: Diagnosis not present

## 2017-04-21 NOTE — Progress Notes (Signed)
Craig Burton , 09-May-1975, 42 y.o., male MRN: [id number] Patient Care Team    Relationship Specialty Notifications Start End  Natalia Leatherwood, DO PCP - General Family Medicine  12/29/15     Chief Complaint  Patient presents with  . Follow-up    Pts is here for f/o visit for ribs fracture and states that he is feeling better now     Subjective: Pt presents for an OV f/u on rib fx and small left pneumothorax sustained during a fall 03/21/2017. Patient reports he is starting to feel much improved. The pain has improved a great deal and he stopped using they diclofenac. He felt that he probably needed to the narcotic, but never took any. He denies any fever, chills, cough or fatigue. He does have a sensation of mild discomfort upon deep breathing located in his left upper anterior chest. Reviewed all prior x-rays and discussed with patient today. Small pneumothorax still present on repeat chest x-ray 03/24/2017.  Dg Chest 2 View  Result Date: 03/24/2017 CLINICAL DATA:  42 year old male with a history of fracture multiple ribs on the left side after a fall EXAM: CHEST  2 VIEW COMPARISON:  03/22/2017 FINDINGS: Cardiomediastinal silhouette unchanged in size and contour. No central vascular congestion. No interlobular septal thickening. No confluent airspace disease.  No pleural effusion. There is questionable persistence of a tiny left apical pneumothorax. Displaced left rib fracture again demonstrated. The adjacent fractures are not well visualized on the AP view. IMPRESSION: There is questionable tiny persisting left apical pneumothorax. Left-sided rib fractures, with no significant change appreciated. Electronically Signed   By: Gilmer Mor D.O.   On: 03/24/2017 16:07    Depression screen [facility] 2/9 01/06/2017 12/29/2015  Decreased Interest 0 0  Down, Depressed, Hopeless 0 0  PHQ - 2 Score 0 0    Allergies  Allergen Reactions  . Penicillins Hives   Social History   Tobacco Use  .  Smoking status: Never Smoker  . Smokeless tobacco: Never Used  Substance Use Topics  . Alcohol use: Yes    Alcohol/week: 4.2 oz    Types: 7 Cans of beer per week    Comment: daily 1-2 drinks    Past Medical History:  Diagnosis Date  . Allergy   . Chicken pox   . Hordeolum externum of right eye   . Hyperlipidemia    Past Surgical History:  Procedure Laterality Date  . VASECTOMY  2011  . WISDOM TOOTH EXTRACTION     Family History  Problem Relation Age of Onset  . Arthritis Mother   . Diabetes Mother   . Prostate cancer Father   . Heart disease Father        MI- 27  . Glaucoma Father   . Arthritis Maternal Grandmother   . Diabetes Maternal Grandmother   . Stroke Maternal Grandmother        90  . Glaucoma Maternal Grandmother   . Diabetes Maternal Grandfather    Allergies as of 04/21/2017      Reactions   Penicillins Hives      Medication List    as of 04/21/2017  5:03 PM   You have not been prescribed any medications.     All past medical history, surgical history, allergies, family history, immunizations andmedications were updated in the EMR today and reviewed under the history and medication portions of their EMR.     ROS: Negative, with the exception of above mentioned in HPI  Objective:  BP 110/71 (BP Location: Left Arm, Patient Position: Sitting, Cuff Size: Normal)   Pulse 67   Temp 98.1 F (36.7 C) (Oral)   Wt 164 lb 6.4 oz (74.6 kg)   SpO2 99%   BMI 22.93 kg/m  Body mass index is 22.93 kg/m. Gen: Afebrile. No acute distress. Nontoxic in appearance, well developed, well nourished. Pleasant Caucasian male. HENT: AT. [place].MMM Eyes:Pupils Equal Round Reactive to light, Extraocular movements intact,  Conjunctiva without redness, discharge or icterus. Neck/lymp/endocrine: Supple, no lymphadenopathy CV: RRR  Chest: CTAB, no wheeze or crackles. Breath sounds equal bilaterally. Good air movement, normal resp effort. No tenderness to palpation over left  ribs. Neuro:  Normal gait. PERLA. EOMi. Alert. Oriented x3  No exam data present No results found. No results found for this or any previous visit (from the past 24 hour(s)).  Assessment/Plan: [name] is a 42 y.o. male present for OV for  Traumatic pneumothorax, subsequent encounter Closed fracture of multiple ribs of left side with routine healing, subsequent encounter - Patient's rib fractures seem to be healing well. Advised him to avoid any contact sports, heavy lifting etc. for at least another 4 weeks. - Patient is feeling mild sensation in his left upper lung field with a very deep breath, lung exam is normal today. Will repeat chest x-ray to ensure resolution of pneumothorax. Patient was educated on pneumothorax condition, possibility of repeat her headaches in the future etc - DG Chest 2 View; Future, left rib x-ray, future - Follow-up dependent upon chest x-ray.   Reviewed expectations re: course of current medical issues.  Discussed self-management of symptoms.  Outlined signs and symptoms indicating need for more acute intervention.  Patient verbalized understanding and all questions were answered.  Patient received an After-Visit Summary.    Orders Placed This Encounter  Procedures  . DG Chest 2 View  . DG Ribs Unilateral Left     Note is dictated utilizing voice recognition software. Although note has been proof read prior to signing, occasional typographical errors still can be missed. If any questions arise, please do not hesitate to call for verification.   electronically signed by:  Felix Pacinienee Kuneff, DO  [facility] [place]

## 2017-04-21 NOTE — Patient Instructions (Signed)
I am glad you are doing well.  Have follow xray completed please so we can make certain complete resolution of lung collapse.     Pneumothorax A pneumothorax, commonly called a collapsed lung, is a condition in which air leaks from a lung and builds up in the space between the lung and the chest wall (pleural space). The air in a pneumothorax is trapped outside the lung and takes up space, preventing the lung from fully expanding. This is a condition that usually occurs suddenly. The buildup of air may be small or large. A small pneumothorax may go away on its own. When a pneumothorax is larger, it will often require medical treatment and hospitalization. What are the causes? A pneumothorax can sometimes happen quickly with no apparent cause. People with underlying lung problems, particularly COPD or emphysema, are at higher risk of pneumothorax. However, pneumothorax can happen quickly even in people with no prior known lung problems. Trauma, surgery, medical procedures, or injury to the chest wall can also cause a pneumothorax. What are the signs or symptoms? Sometimes a pneumothorax will have no symptoms. When symptoms are present, they can include:  Chest pain.  Shortness of breath.  Increased rate of breathing.  Bluish color to your lips or skin (cyanosis).  How is this diagnosed? Pneumothorax is usually diagnosed by a chest X-ray or chest CT scan. Your health care provider will also take a medical history and perform a physical exam to determine why you may have a pneumothorax. How is this treated? A small pneumothorax may go away on its own without treatment. Extra oxygen can sometimes help a small pneumothorax go away more quickly. For a larger pneumothorax or a pneumothorax that is causing symptoms, a procedure is usually needed to drain the air.In some cases, the health care provider may drain the air using a needle. In other cases, a chest tube may be inserted into the pleural  space. A chest tube is a small tube placed between the ribs and into the pleural space. This removes the extra air and allows the lung to expand back to its normal size. A large pneumothorax will usually require a hospital stay. If there is ongoing air leakage into the pleural space, then the chest tube may need to remain in place for several days until the air leak has healed. In some cases, surgery may be needed. Follow these instructions at home:  Only take over-the-counter or prescription medicines as directed by your health care provider.  If a cough or pain makes it difficult for you to sleep at night, try sleeping in a semi-upright position in a recliner or by using 2 or 3 pillows.  Rest and limit activity as directed by your health care provider.  If you had a chest tube and it was removed, ask your health care provider when it is okay to remove the dressing. Until your health care provider says you can remove the dressing, do not allow it to get wet.  Do not smoke. Smoking is a risk factor for pneumothorax.  Do not fly in an airplane or scuba dive until your health care provider says it is okay.  Follow up with your health care provider as directed. Get help right away if:  You have increasing chest pain or shortness of breath.  You have a cough that is not controlled with suppressants.  You begin coughing up blood.  You have pain that is getting worse or is not controlled with medicines.  You cough up thick, discolored mucus (sputum) that is yellow to green in color.  You have redness, increasing pain, or discharge at the site where a chest tube had been in place (if your pneumothorax was treated with a chest tube).  The site where your chest tube was located opens up.  You feel air coming out of the site where the chest tube was placed.  You have a fever or persistent symptoms for more than 2-3 days.  You have a fever and your symptoms suddenly get worse. This  information is not intended to replace advice given to you by your health care provider. Make sure you discuss any questions you have with your health care provider. Document Released: 03/28/2005 Document Revised: 09/03/2015 Document Reviewed: 08/21/2013 Elsevier Interactive Patient Education  [employer].

## 2017-05-01 ENCOUNTER — Ambulatory Visit (HOSPITAL_BASED_OUTPATIENT_CLINIC_OR_DEPARTMENT_OTHER)
Admission: RE | Admit: 2017-05-01 | Discharge: 2017-05-01 | Disposition: A | Discharge status: Home / Self Care | Payer: BLUE CROSS/BLUE SHIELD | Source: Ambulatory Visit | Attending: Family Medicine | Admitting: Family Medicine

## 2017-05-01 DIAGNOSIS — S2241XD Multiple fractures of ribs, right side, subsequent encounter for fracture with routine healing: Secondary | ICD-10-CM | POA: Diagnosis not present

## 2017-05-01 DIAGNOSIS — S2232XD Fracture of one rib, left side, subsequent encounter for fracture with routine healing: Secondary | ICD-10-CM | POA: Diagnosis not present

## 2017-05-01 DIAGNOSIS — S2242XD Multiple fractures of ribs, left side, subsequent encounter for fracture with routine healing: Secondary | ICD-10-CM | POA: Insufficient documentation

## 2017-05-01 DIAGNOSIS — X58XXXD Exposure to other specified factors, subsequent encounter: Secondary | ICD-10-CM | POA: Diagnosis not present

## 2017-05-01 DIAGNOSIS — S270XXD Traumatic pneumothorax, subsequent encounter: Secondary | ICD-10-CM

## 2017-11-28 ENCOUNTER — Encounter: Payer: Self-pay | Admitting: Family Medicine

## 2017-11-29 ENCOUNTER — Encounter: Payer: Self-pay | Admitting: Family Medicine

## 2017-11-29 ENCOUNTER — Ambulatory Visit: Payer: BLUE CROSS/BLUE SHIELD | Admitting: Family Medicine

## 2017-11-29 ENCOUNTER — Other Ambulatory Visit: Payer: Self-pay

## 2017-11-29 VITALS — BP 126/86 | HR 68 | Temp 97.4°F | Resp 16 | Ht 71.0 in | Wt 165.0 lb

## 2017-11-29 DIAGNOSIS — A09 Infectious gastroenteritis and colitis, unspecified: Secondary | ICD-10-CM | POA: Diagnosis not present

## 2017-11-29 MED ORDER — AZITHROMYCIN 500 MG PO TABS: 500.0000 mg | ORAL_TABLET | Freq: Every day | ORAL | 0 refills | Status: DC

## 2017-11-29 MED ORDER — DICYCLOMINE HCL 10 MG PO CAPS: 10.0000 mg | ORAL_CAPSULE | Freq: Three times a day (TID) | ORAL | 0 refills | Status: DC

## 2017-11-29 NOTE — Patient Instructions (Signed)
Take azithromycin 1 tab for 3 days.  HYDRATE! Water and Gatorade.   Avoid dairy.   Diarrhea, Adult Diarrhea is when you have loose and water poop (stool) often. Diarrhea can make you feel weak and cause you to get dehydrated. Dehydration can make you tired and thirsty, make you have a dry mouth, and make it so you pee (urinate) less often. Diarrhea often lasts 2-3 days. However, it can last longer if it is a sign of something more serious. It is important to treat your diarrhea as told by your doctor. Follow these instructions at home: Eating and drinking  Follow these recommendations as told by your doctor:  Take an oral rehydration solution (ORS). This is a drink that is sold at pharmacies and stores.  Drink clear fluids, such as: ? Water. ? Ice chips. ? Diluted fruit juice. ? Low-calorie sports drinks.  Eat bland, easy-to-digest foods in small amounts as you are able. These foods include: ? Bananas. ? Applesauce. ? Rice. ? Low-fat (lean) meats. ? Toast. ? Crackers.  Avoid drinking fluids that have a lot of sugar or caffeine in them.  Avoid alcohol.  Avoid spicy or fatty foods.  General instructions   Drink enough fluid to keep your pee (urine) clear or pale yellow.  Wash your hands often. If you cannot use soap and water, use hand sanitizer.  Make sure that all people in your home wash their hands well and often.  Take over-the-counter and prescription medicines only as told by your doctor.  Rest at home while you get better.  Watch your condition for any changes.  Take a warm bath to help with any burning or pain from having diarrhea.  Keep all follow-up visits as told by your doctor. This is important. Contact a doctor if:  You have a fever.  Your diarrhea gets worse.  You have new symptoms.  You cannot keep fluids down.  You feel light-headed or dizzy.  You have a headache.  You have muscle cramps. Get help right away if:  You have chest  pain.  You feel very weak or you pass out (faint).  You have bloody or black poop or poop that look like tar.  You have very bad pain, cramping, or bloating in your belly (abdomen).  You have trouble breathing or you are breathing very quickly.  Your heart is beating very quickly.  Your skin feels cold and clammy.  You feel confused.  You have signs of dehydration, such as: ? Dark pee, hardly any pee, or no pee. ? Cracked lips. ? Dry mouth. ? Sunken eyes. ? Sleepiness. ? Weakness. This information is not intended to replace advice given to you by your health care provider. Make sure you discuss any questions you have with your health care provider. Document Released: 09/14/2007 Document Revised: 10/16/2015 Document Reviewed: 12/02/2014 Elsevier Interactive Patient Education  2018 [employer]

## 2017-11-29 NOTE — Progress Notes (Signed)
Craig Burton , 1976-01-27, 42 y.o., male MRN: [id number] Patient Care Team    Relationship Specialty Notifications Start End  Natalia LeatherwoodKuneff, Renee A, DO PCP - General Family Medicine  12/29/15     Chief Complaint  Patient presents with  . Diarrhea    nausea and stomach cramping     Subjective: Pt presents for an OV with complaints of diarrhea of 4 days duration.  Associated symptoms include fatigue.  He reports he has had approximately 10 watery stools a day.  He has just returned from a trip to [place].  He denies fevers, chills or nausea.  Reports abdominal cramping, but not pain.  Denies any blood per rectum. Pt denies exposure  to insects,  under prepared foods, antibiotics  or sick contacts. She has attempted to keep up with hydration by drinking more water and Gatorade.  [facility] 01/06/2017 12/29/2015  Decreased Interest 0 0  Down, Depressed, Hopeless 0 0  PHQ - 2 Score 0 0    Allergies  Allergen Reactions  . Penicillins Hives   Social History   Tobacco Use  . Smoking status: Never Smoker  . Smokeless tobacco: Never Used  Substance Use Topics  . Alcohol use: Yes    Alcohol/week: 7.0 standard drinks    Types: 7 Cans of beer per week    Comment: daily 1-2 drinks    Past Medical History:  Diagnosis Date  . Allergy   . Chicken pox   . Hordeolum externum of right eye   . Hyperlipidemia    Past Surgical History:  Procedure Laterality Date  . VASECTOMY  2011  . WISDOM TOOTH EXTRACTION     Family History  Problem Relation Age of Onset  . Arthritis Mother   . Diabetes Mother   . Prostate cancer Father   . Heart disease Father        MI- [id number]  . Glaucoma Father   . Arthritis Maternal Grandmother   . Diabetes Maternal Grandmother   . Stroke Maternal Grandmother        90  . Glaucoma Maternal Grandmother   . Diabetes Maternal Grandfather    Allergies as of 11/29/2017      Reactions   Penicillins Hives      Medication List        Accurate as of  11/29/17 10:50 AM. Always use your most recent med list.          Fish Oil 1000 MG Caps Take by mouth 2 (two) times daily.       All past medical history, surgical history, allergies, family history, immunizations andmedications were updated in the EMR today and reviewed under the history and medication portions of their EMR.     ROS: Negative, with the exception of above mentioned in HPI   Objective:  BP 126/86 (BP Location: Right Arm, Patient Position: Sitting, Cuff Size: Normal)   Pulse 68   Temp (!) 97.4 F (36.3 C) (Oral)   Resp 16   Ht 5\' 11"  (1.803 m)   Wt 165 lb (74.8 kg)   SpO2 97%   BMI 23.01 kg/m  Body mass index is 23.01 kg/m. Gen: Afebrile. No acute distress. Nontoxic in appearance, well developed, well nourished.  Pleasant, Caucasian male. HENT: AT. [place].  Mildly tacky mucous membranes.  Eyes:Pupils Equal Round Reactive to light, Extraocular movements intact,  Conjunctiva without redness, discharge or icterus. CV: RRR  Chest: CTAB, no wheeze or crackles.  Abd: Soft.  Flat. NTND. BS present.  No masses palpated. No rebound or guarding.  Skin: No rashes, purpura or petechiae.  Neuro:  Normal gait. PERLA. EOMi. Alert. Oriented x3  No exam data present No results found. No results found for this or any previous visit (from the past 24 hour(s)).  Assessment/Plan: Craig Burton is a 42 y.o. male present for OV for  Traveler's diarrhea Hydrate with water and Gatorade.  Avoid dairy products.  Try to avoid antidiarrheals unless absolutely necessary in the event of dehydration. He declined Zofran today.  He Bentyl, however I did prescribe and a printed prescription in case he changes his mind later. -Prescribed azithromycin 500 mg daily for 3 days. -Diarrhea is still present in 1 week and not improving, he will need to follow-up for intervention.   Reviewed expectations re: course of current medical issues.  Discussed self-management of symptoms.  Outlined signs  and symptoms indicating need for more acute intervention.  Patient verbalized understanding and all questions were answered.  Patient received an After-Visit Summary.    No orders of the defined types were placed in this encounter.    Note is dictated utilizing voice recognition software. Although note has been proof read prior to signing, occasional typographical errors still can be missed. If any questions arise, please do not hesitate to call for verification.   electronically signed by:  Felix Pacinienee Kuneff, DO  [facility] [place]

## 2018-01-05 IMAGING — DX DG CHEST 2V
2 series · 2 of 2 positions shown · non-contrast
Comparison: 03/22/2017

CLINICAL DATA: 41-year-old male with a history of fracture multiple
ribs on the left side after a fall

EXAM:
CHEST  2 VIEW

[chest pa]
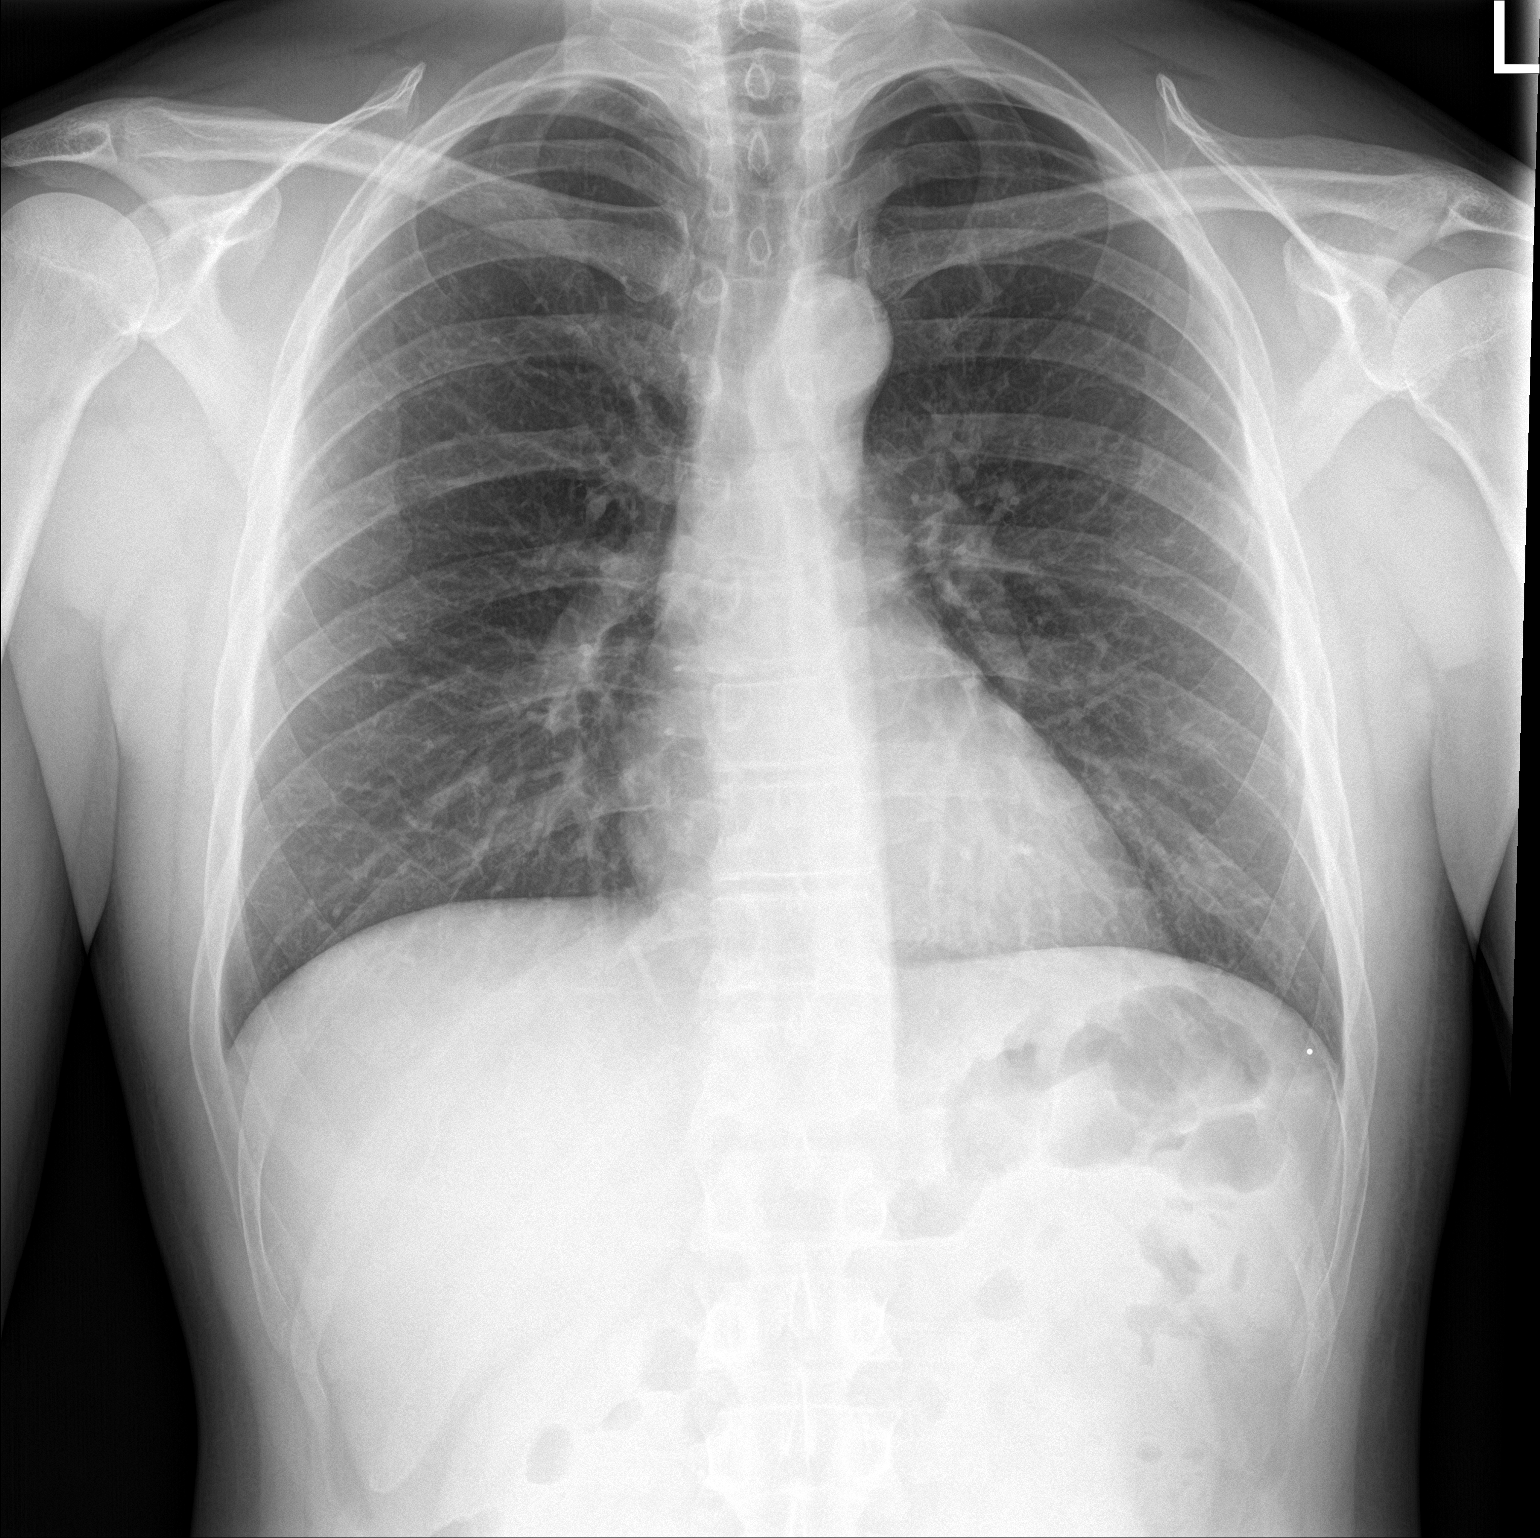

[chest lat]
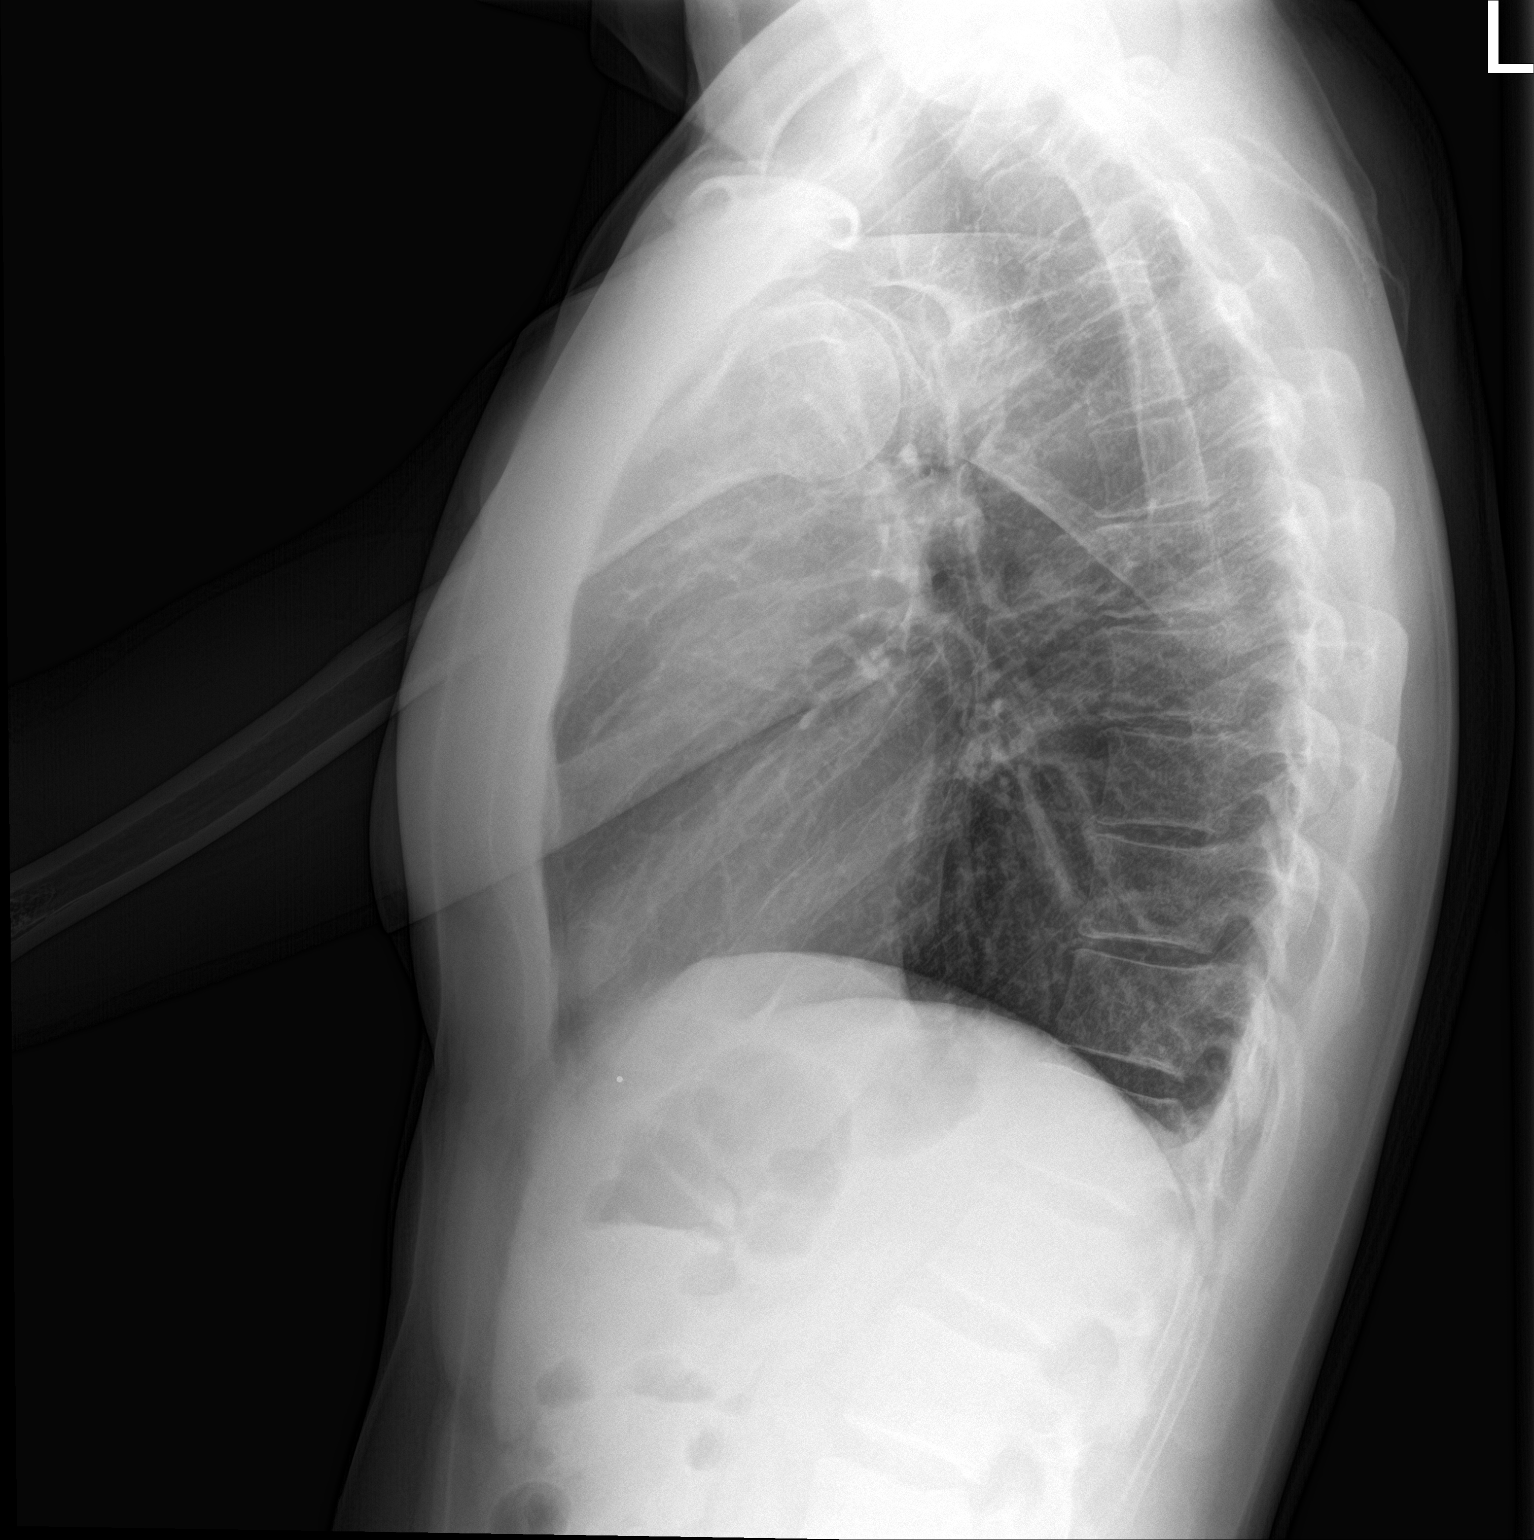

[2 of 2 positions shown; findings below may reference images not displayed]

FINDINGS: Cardiomediastinal silhouette unchanged in size and contour. No
central vascular congestion. No interlobular septal thickening.

No confluent airspace disease.  No pleural effusion.

There is questionable persistence of a tiny left apical
pneumothorax.

Displaced left rib fracture again demonstrated. The adjacent
fractures are not well visualized on the AP view.
IMPRESSION: There is questionable tiny persisting left apical pneumothorax.

Left-sided rib fractures, with no significant change appreciated.

## 2018-01-08 ENCOUNTER — Encounter: Payer: BLUE CROSS/BLUE SHIELD | Admitting: Family Medicine

## 2018-01-12 ENCOUNTER — Ambulatory Visit (INDEPENDENT_AMBULATORY_CARE_PROVIDER_SITE_OTHER): Payer: BLUE CROSS/BLUE SHIELD | Admitting: Family Medicine

## 2018-01-12 ENCOUNTER — Encounter: Payer: Self-pay | Admitting: Family Medicine

## 2018-01-12 VITALS — BP 118/82 | HR 59 | Temp 98.0°F | Resp 20 | Ht 71.0 in | Wt 163.0 lb

## 2018-01-12 DIAGNOSIS — Z0000 Encounter for general adult medical examination without abnormal findings: Secondary | ICD-10-CM | POA: Diagnosis not present

## 2018-01-12 DIAGNOSIS — Z131 Encounter for screening for diabetes mellitus: Secondary | ICD-10-CM

## 2018-01-12 DIAGNOSIS — Z130 Encounter for screening for diseases of the blood and blood-forming organs and certain disorders involving the immune mechanism: Secondary | ICD-10-CM

## 2018-01-12 DIAGNOSIS — I7300 Raynaud's syndrome without gangrene: Secondary | ICD-10-CM | POA: Insufficient documentation

## 2018-01-12 DIAGNOSIS — E785 Hyperlipidemia, unspecified: Secondary | ICD-10-CM | POA: Diagnosis not present

## 2018-01-12 LAB — LIPID PANEL
CHOL/HDL RATIO: 4
CHOLESTEROL: 216 mg/dL — AB (ref 0–200)
HDL: 50.4 mg/dL (ref >39.00)
LDL CALC: 156 mg/dL — AB (ref 0–99)
NONHDL: 165.19
Triglycerides: 47 mg/dL (ref 0.0–149.0)
VLDL: 9.4 mg/dL (ref 0.0–40.0)

## 2018-01-12 LAB — COMPREHENSIVE METABOLIC PANEL
ALBUMIN: 4.7 g/dL (ref 3.5–5.2)
ALT: 28 U/L (ref 0–53)
AST: 23 U/L (ref 0–37)
Alkaline Phosphatase: 46 U/L (ref 39–117)
BUN: 12 mg/dL (ref 6–23)
CALCIUM: 9.7 mg/dL (ref 8.4–10.5)
CO2: 31 meq/L (ref 19–32)
CREATININE: 0.82 mg/dL (ref 0.40–1.50)
Chloride: 101 mEq/L (ref 96–112)
GFR: 109.01 mL/min (ref >60.00)
Glucose, Bld: 73 mg/dL (ref 70–99)
Potassium: 4.1 mEq/L (ref 3.5–5.1)
Sodium: 140 mEq/L (ref 135–145)
TOTAL PROTEIN: 7.6 g/dL (ref 6.0–8.3)
Total Bilirubin: 0.9 mg/dL (ref 0.2–1.2)

## 2018-01-12 LAB — CBC WITH DIFFERENTIAL/PLATELET
BASOS ABS: 0 10*3/uL (ref 0.0–0.1)
Basophils Relative: 0.9 % (ref 0.0–3.0)
Eosinophils Absolute: 0 10*3/uL (ref 0.0–0.7)
Eosinophils Relative: 0.9 % (ref 0.0–5.0)
HCT: 45.2 % (ref 39.0–52.0)
HEMOGLOBIN: 15 g/dL (ref 13.0–17.0)
Lymphocytes Relative: 36 % (ref 12.0–46.0)
Lymphs Abs: 1.9 10*3/uL (ref 0.7–4.0)
MCHC: 33.2 g/dL (ref 30.0–36.0)
MCV: 96.2 fl (ref 78.0–100.0)
MONOS PCT: 9.4 % (ref 3.0–12.0)
Monocytes Absolute: 0.5 10*3/uL (ref 0.1–1.0)
NEUTROS ABS: 2.8 10*3/uL (ref 1.4–7.7)
Neutrophils Relative %: 52.8 % (ref 43.0–77.0)
PLATELETS: 254 10*3/uL (ref 150.0–400.0)
RBC: 4.7 Mil/uL (ref 4.22–5.81)
RDW: 12.7 % (ref 11.5–15.5)
WBC: 5.3 10*3/uL (ref 4.0–10.5)

## 2018-01-12 LAB — HEMOGLOBIN A1C: HEMOGLOBIN A1C: 5 % (ref 4.6–6.5)

## 2018-01-12 NOTE — Patient Instructions (Signed)

## 2018-01-12 NOTE — Progress Notes (Signed)
Patient ID: Craig Burton, male  DOB: 1975-07-20, 42 y.o.   MRN: [id number] Patient Care Team    Relationship Specialty Notifications Start End  Ma Hillock, DO PCP - General Family Medicine  12/29/15     Chief Complaint  Patient presents with  . Annual Exam    Subjective:  Craig Burton is a 42 y.o. male present for CPE. All past medical history, surgical history, allergies, family history, immunizations, medications and social history were updated in the electronic medical record today. All recent labs, ED visits and hospitalizations within the last year were reviewed.  raynaud's syndrome:  He reports over the last year having infrequent episodes of blanching of his fingers, that quickly resolve. He reports it burns a little when this occurs. It has been cold every occasion it has occurred.    Health maintenance: updated 01/12/18 Colonoscopy: No fhx, screen at 50 Immunizations: tdap 2013, Influenza declined (encouraged yearly) Infectious disease screening: HIV declined today PSA:  No Fhx. Screening discussion at 50-55. Assistive device: none Oxygen IFO:YDXA Patient has a Dental home. Hospitalizations/ED visits: reviewed  Depression screen [facility] 2/9 01/12/2018 11/29/2017 01/06/2017 12/29/2015  Decreased Interest 0 0 0 0  Down, Depressed, Hopeless 0 0 0 0  PHQ - 2 Score 0 0 0 0   No flowsheet data found.   Current Exercise Habits: Structured exercise class, Type of exercise: strength training/weights;Other - see comments(running), Time (Minutes): 60, Frequency (Times/Week): 4, Weekly Exercise (Minutes/Week): 240, Intensity: Moderate   Fall Risk  11/29/2017 12/29/2015  Falls in the past year? No No    Immunization History  Administered Date(s) Administered  . Tdap 04/12/2011    Past Medical History:  Diagnosis Date  . Allergy   . Chicken pox   . Hordeolum externum of right eye   . Hyperlipidemia    Allergies  Allergen Reactions  . Penicillins Hives   Past  Surgical History:  Procedure Laterality Date  . VASECTOMY  2011  . WISDOM TOOTH EXTRACTION     Family History  Problem Relation Age of Onset  . Arthritis Mother   . Diabetes Mother   . Prostate cancer Father   . Heart disease Father        MI- 82  . Glaucoma Father   . Arthritis Maternal Grandmother   . Diabetes Maternal Grandmother   . Stroke Maternal Grandmother        90  . Glaucoma Maternal Grandmother   . Diabetes Maternal Grandfather    Social History   Socioeconomic History  . Marital status: Married    Spouse name: Not on file  . Number of children: Not on file  . Years of education: Not on file  . Highest education level: Not on file  Occupational History  . Not on file  Social Needs  . Financial resource strain: Not on file  . Food insecurity:    Worry: Not on file    Inability: Not on file  . Transportation needs:    Medical: Not on file    Non-medical: Not on file  Tobacco Use  . Smoking status: Never Smoker  . Smokeless tobacco: Never Used  Substance and Sexual Activity  . Alcohol use: Yes    Alcohol/week: 7.0 standard drinks    Types: 7 Cans of beer per week    Comment: daily 1-2 drinks   . Drug use: No  . Sexual activity: Yes    Birth control/protection: None  Lifestyle  . Physical  activity:    Days per week: Not on file    Minutes per session: Not on file  . Stress: Not on file  Relationships  . Social connections:    Talks on phone: Not on file    Gets together: Not on file    Attends religious service: Not on file    Active member of club or organization: Not on file    Attends meetings of clubs or organizations: Not on file    Relationship status: Not on file  . Intimate partner violence:    Fear of current or ex partner: Not on file    Emotionally abused: Not on file    Physically abused: Not on file    Forced sexual activity: Not on file  Other Topics Concern  . Not on file  Social History Narrative   Married to [place]).  2 Children (Will and [name]).    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    Feels safe in his relationships.    Allergies as of 01/12/2018      Reactions   Penicillins Hives      Medication List        Accurate as of 01/12/18  2:51 PM. Always use your most recent med list.          Fish Oil 1000 MG Caps Take by mouth 2 (two) times daily.      All past medical history, surgical history, allergies, family history, immunizations andmedications were updated in the EMR today and reviewed under the history and medication portions of their EMR.     No results found for this or any previous visit (from the past 2160 hour(s)).  ROS: 14 pt review of systems performed and negative (unless mentioned in an HPI)  Objective: BP 118/82 (BP Location: Left Arm, Patient Position: Sitting, Cuff Size: Large)   Pulse (!) 59   Temp 98 F (36.7 C)   Resp 20   Ht '5\' 11"'$  (1.803 m)   Wt 163 lb (73.9 kg)   SpO2 97%   BMI 22.73 kg/m  Gen: Afebrile. No acute distress. Nontoxic in appearance, well-developed, well-nourished,  Pleasant, caucasian male.  HENT: AT. [place]. Bilateral TM visualized and normal in appearance, normal external auditory canal. MMM, no oral lesions, adequate dentition. Bilateral nares within normal limits. Throat without erythema, ulcerations or exudates. no Cough on exam, no hoarseness on exam. Eyes:Pupils Equal Round Reactive to light, Extraocular movements intact,  Conjunctiva without redness, discharge or icterus. Neck/lymp/endocrine: Supple,no lymphadenopathy, no thyromegaly CV: RRR no murmur, no edema, +2/4 P posterior tibialis pulses. no carotid bruits. No JVD. Chest: CTAB, no wheeze, rhonchi or crackles. normal Respiratory effort. good Air movement. Abd: Soft. flat. NTND. BS present. no Masses palpated. No hepatosplenomegaly. No rebound tenderness or guarding. Skin: no rashes, purpura or  petechiae. Warm and well-perfused. Skin intact. Neuro/Msk:  Normal gait. PERLA. EOMi. Alert. Oriented x3.  Cranial nerves II through XII intact. Muscle strength 5/5 upper/lower extremity. DTRs equal bilaterally. Psych: Normal affect, dress and demeanor. Normal speech. Normal thought content and judgment.  No exam data present  Assessment/plan: Craig Burton is a 42 y.o. male present for CPE Elevated lipids - taking fish oil daily.  - Comp Met (CMET) - Lipid panel Screening for diabetes mellitus - HgB A1c Screening for iron deficiency anemia - CBC w/Diff Raynaud's disease without gangrene - discussed prevention- keep  hands warm in winer, wear gloves etc. If worsening with more frequent or longer episodes will consider further work up and/or low dose amlodipine. Encounter for preventive health examination Patient was encouraged to exercise greater than 150 minutes a week. Patient was encouraged to choose a diet filled with fresh fruits and vegetables, and lean meats. AVS provided to patient today for education/recommendation on gender specific health and safety maintenance. Colonoscopy: No fhx, screen at 50 Immunizations: tdap 2013, Influenza declined (encouraged yearly) Infectious disease screening: HIV declined today PSA:  No Fhx. Screening discussion at 50-55. Assistive device: none Oxygen XAJ:OINO Patient has a Dental home. Hospitalizations/ED visits: reviewed  Return in about 1 year (around 01/13/2019) for CPE.  Note is dictated utilizing voice recognition software. Although note has been proof read prior to signing, occasional typographical errors still can be missed. If any questions arise, please do not hesitate to call for verification.  Electronically signed by: Howard Pouch, DO [place]

## 2018-01-15 ENCOUNTER — Telehealth: Payer: Self-pay | Admitting: *Deleted

## 2018-01-15 NOTE — Telephone Encounter (Signed)
Spoke with patient reviewed lab results and instructions. Patient verbalized understanding. 

## 2018-02-12 IMAGING — DX DG RIBS 2V*L*
3 series · 3 of 3 positions shown · non-contrast
Comparison: March 22, 2017

CLINICAL DATA: Fracture of left ribs 2 weeks ago.  Follow-up.

EXAM:
LEFT RIBS - 2 VIEW

[rib pa (1 of 2)]
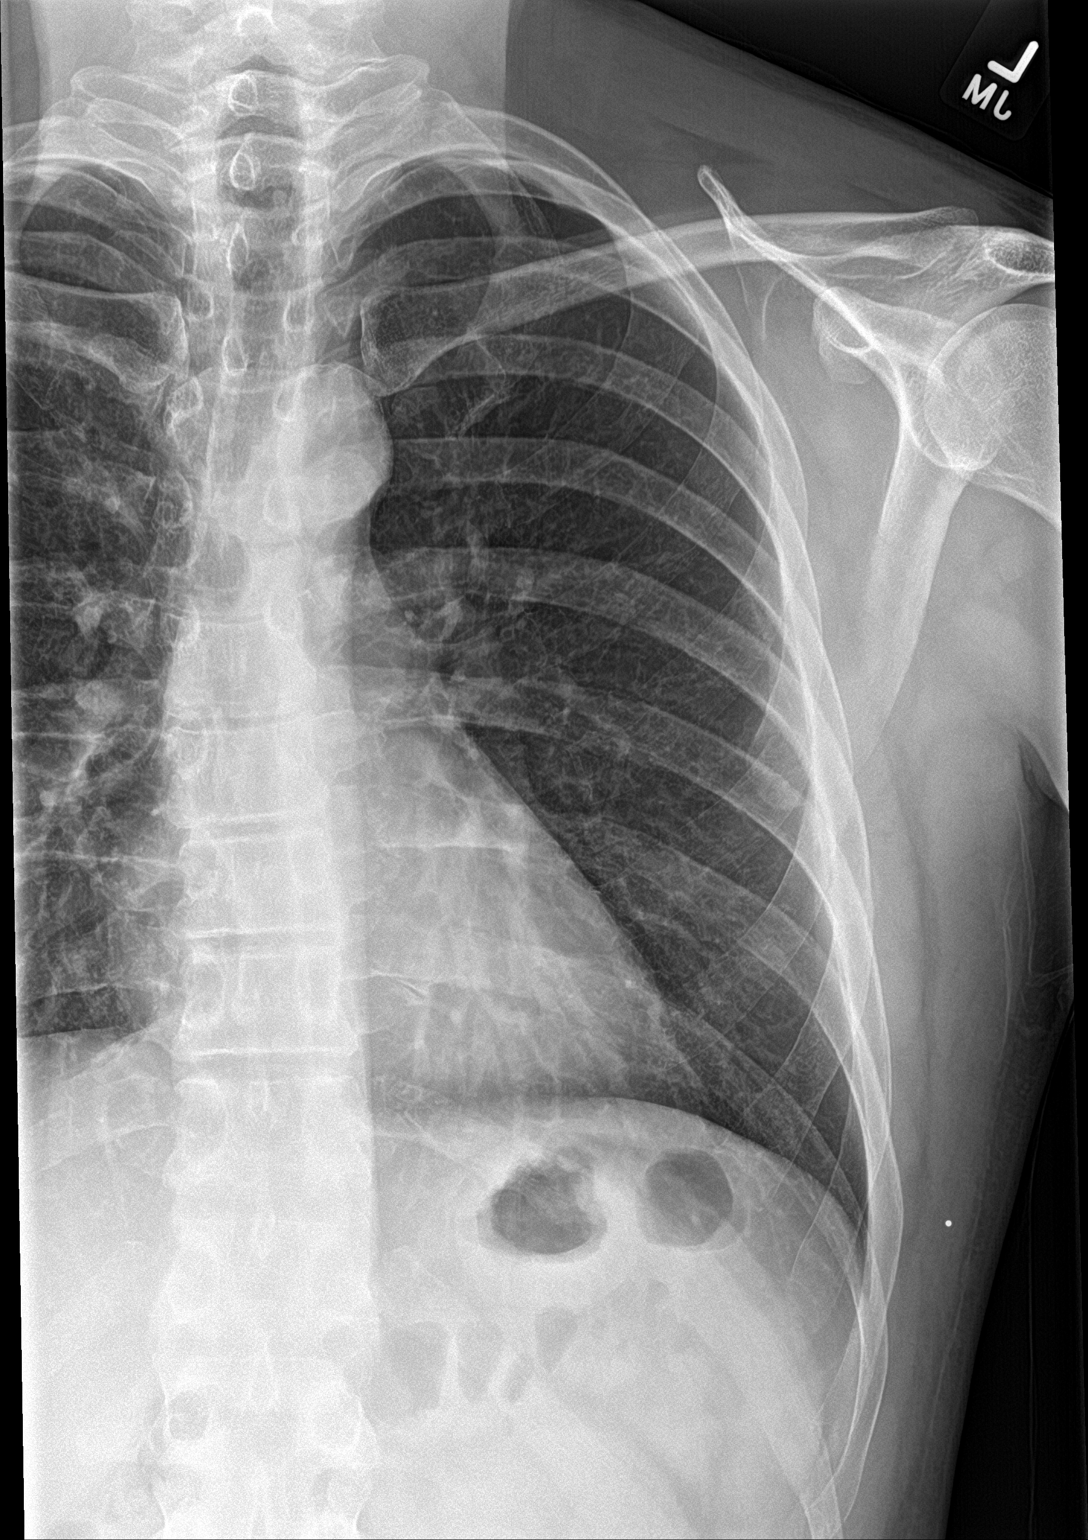

[rib pa obl]
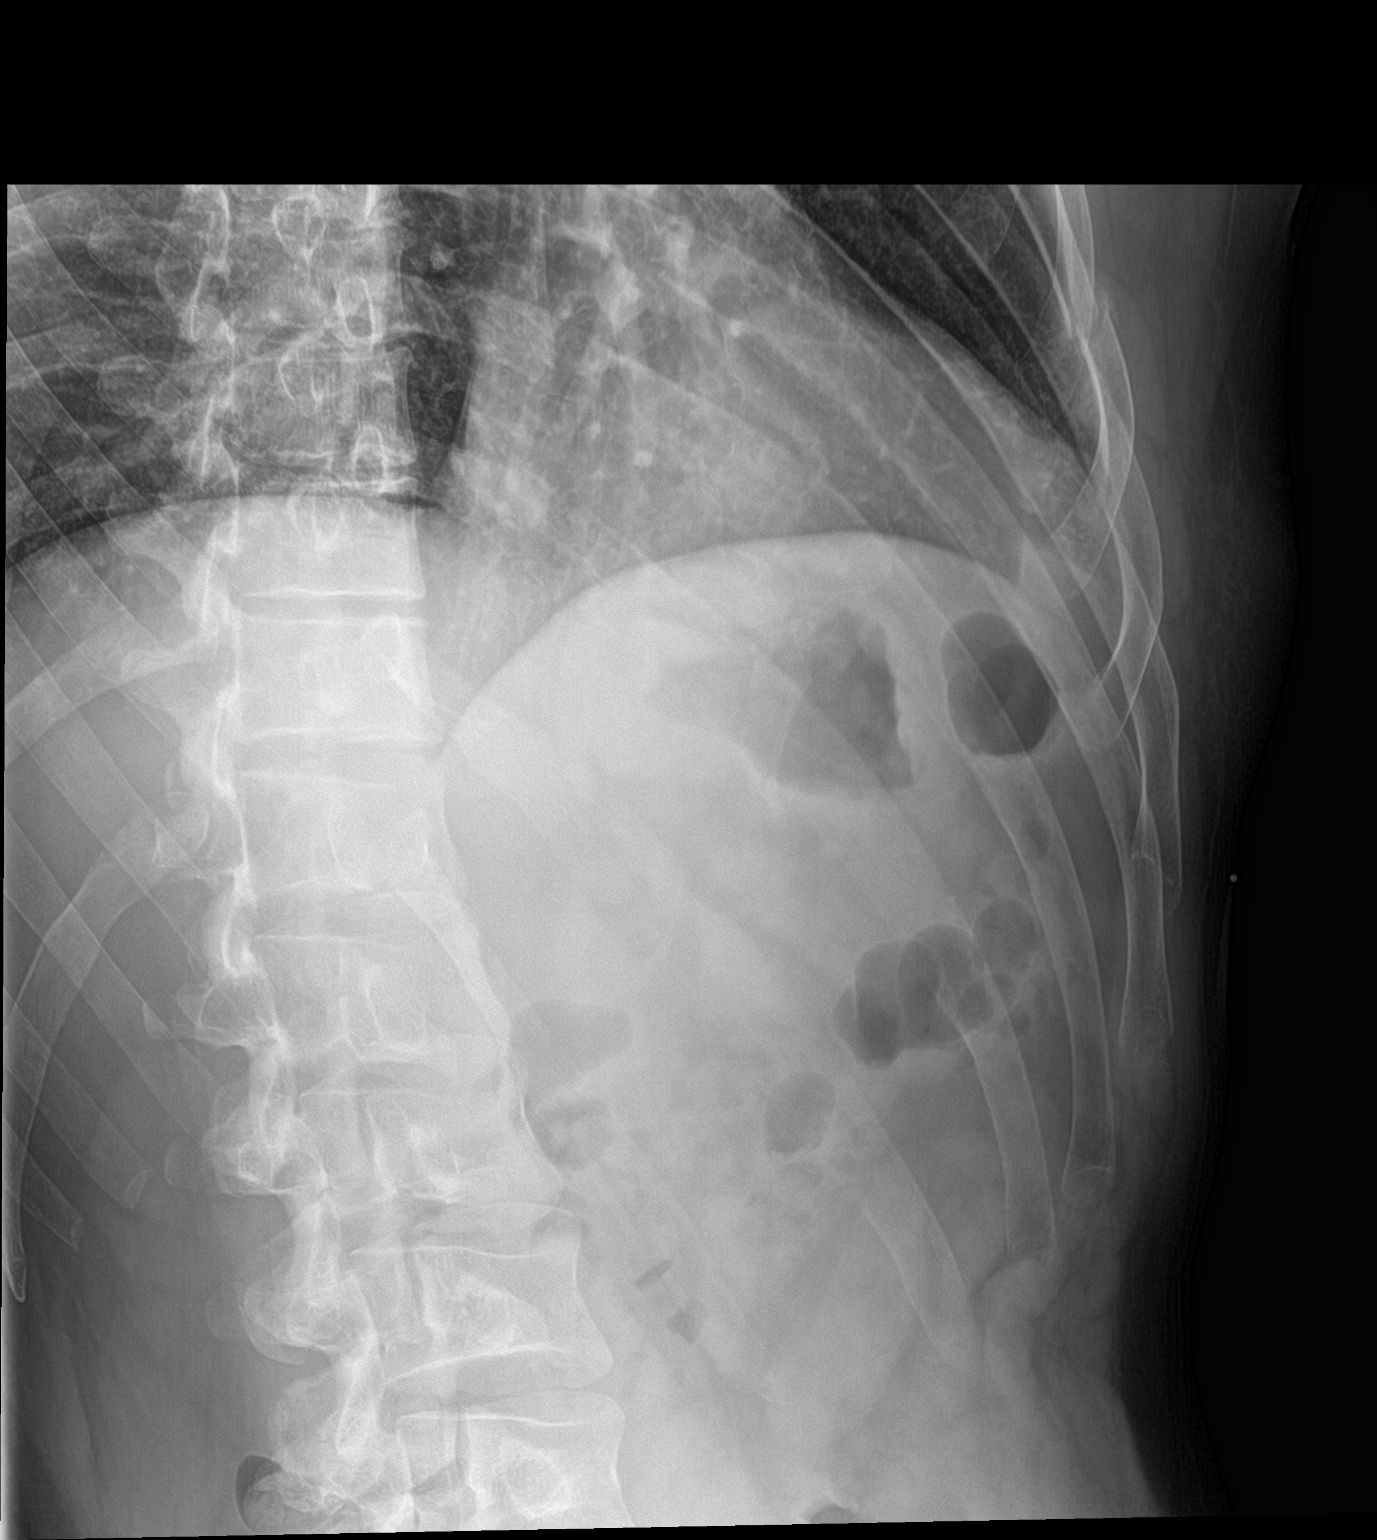

[rib pa (2 of 2)]
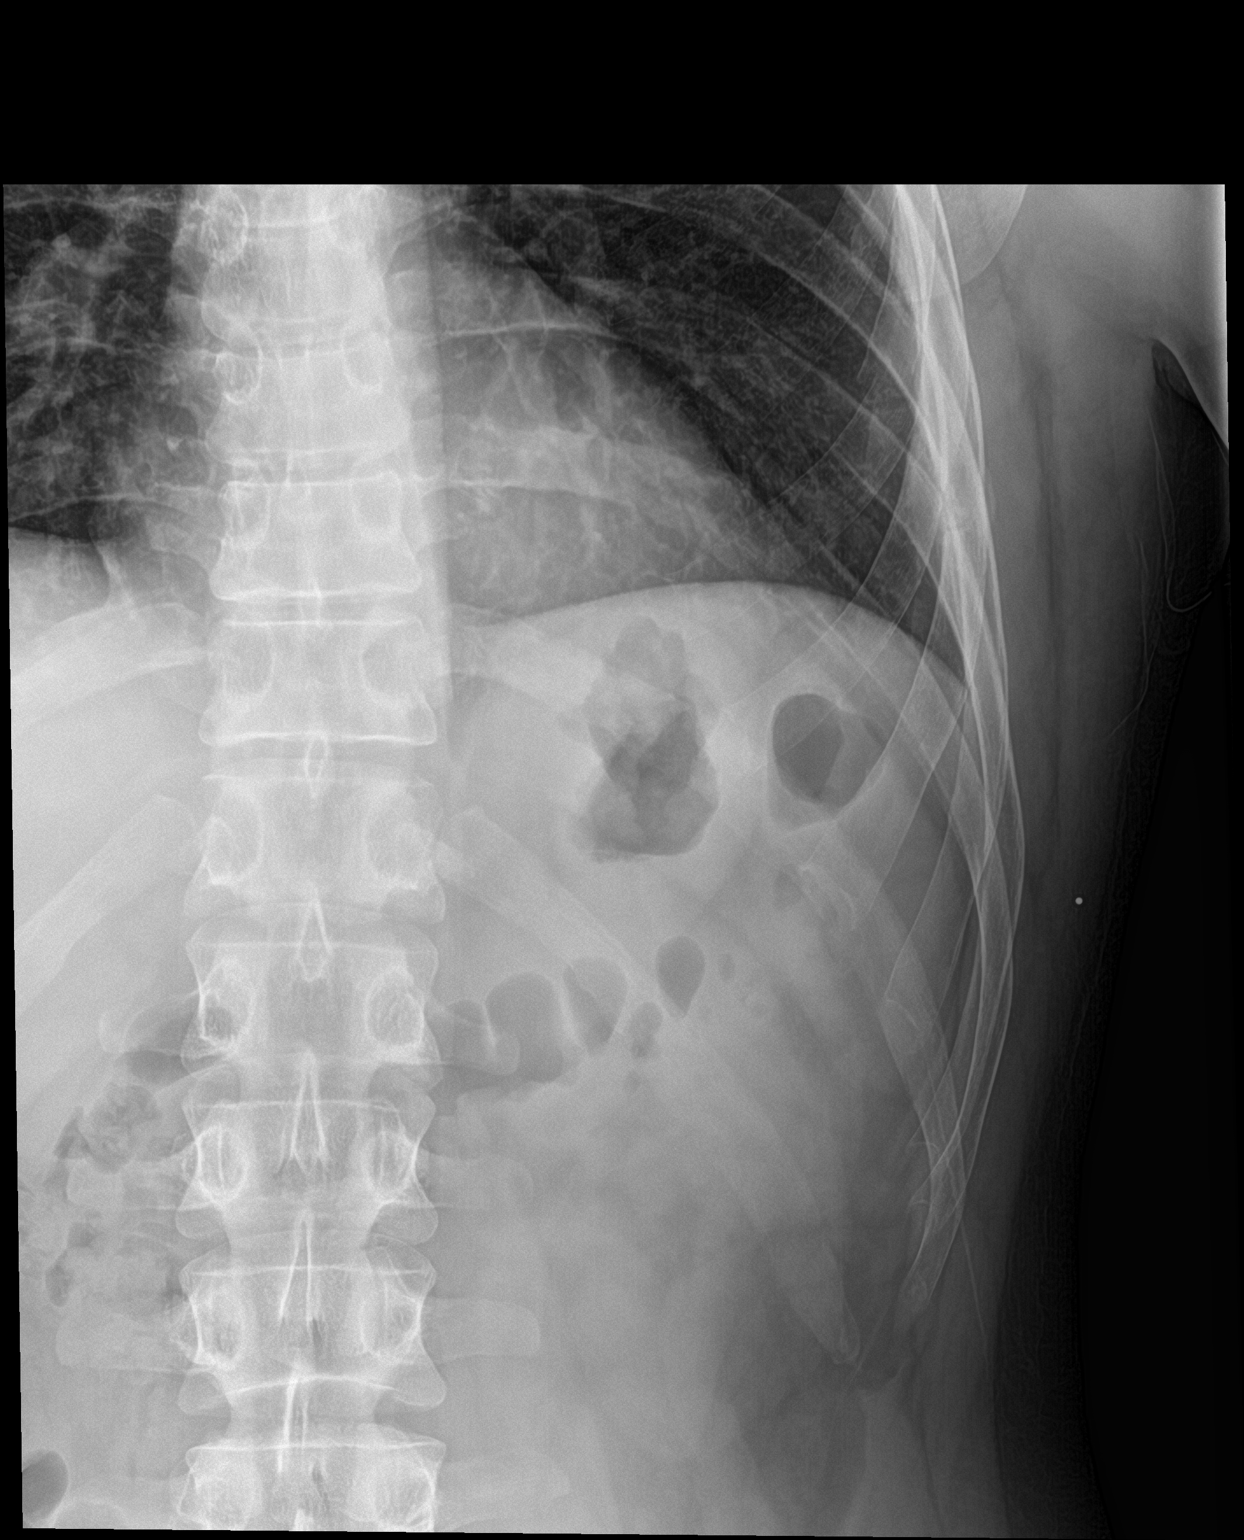

[3 of 3 positions shown; findings below may reference images not displayed]

FINDINGS: Previously noted noted displaced fracture of the lateral left fifth
rib show signs of callus formation in healing. The previously noted
nondisplaced fracture of the sixth rib is not well seen.
IMPRESSION: Previously noted noted displaced fracture of the lateral left fifth
rib show signs of callus formation in healing. The previously noted
nondisplaced fracture of the sixth rib is not well seen.

## 2018-05-05 DIAGNOSIS — M25562 Pain in left knee: Secondary | ICD-10-CM | POA: Diagnosis not present

## 2018-05-07 DIAGNOSIS — M25562 Pain in left knee: Secondary | ICD-10-CM | POA: Diagnosis not present

## 2018-05-10 DIAGNOSIS — M25562 Pain in left knee: Secondary | ICD-10-CM | POA: Diagnosis not present

## 2018-05-14 DIAGNOSIS — S83412A Sprain of medial collateral ligament of left knee, initial encounter: Secondary | ICD-10-CM | POA: Diagnosis not present

## 2018-06-05 DIAGNOSIS — S83412D Sprain of medial collateral ligament of left knee, subsequent encounter: Secondary | ICD-10-CM | POA: Diagnosis not present

## 2018-07-05 DIAGNOSIS — S83412D Sprain of medial collateral ligament of left knee, subsequent encounter: Secondary | ICD-10-CM | POA: Diagnosis not present

## 2018-07-24 DIAGNOSIS — D2239 Melanocytic nevi of other parts of face: Secondary | ICD-10-CM | POA: Diagnosis not present

## 2019-01-14 ENCOUNTER — Encounter: Payer: BLUE CROSS/BLUE SHIELD | Admitting: Family Medicine

## 2019-01-18 ENCOUNTER — Encounter: Payer: BLUE CROSS/BLUE SHIELD | Admitting: Family Medicine

## 2019-01-29 ENCOUNTER — Ambulatory Visit (INDEPENDENT_AMBULATORY_CARE_PROVIDER_SITE_OTHER): Payer: BC Managed Care – PPO | Admitting: Family Medicine

## 2019-01-29 ENCOUNTER — Other Ambulatory Visit: Payer: Self-pay

## 2019-01-29 ENCOUNTER — Encounter: Payer: Self-pay | Admitting: Family Medicine

## 2019-01-29 VITALS — BP 121/82 | HR 56 | Temp 98.4°F | Resp 16 | Ht 71.0 in | Wt 152.1 lb

## 2019-01-29 DIAGNOSIS — Z8489 Family history of other specified conditions: Secondary | ICD-10-CM

## 2019-01-29 DIAGNOSIS — Z0001 Encounter for general adult medical examination with abnormal findings: Secondary | ICD-10-CM | POA: Diagnosis not present

## 2019-01-29 DIAGNOSIS — E785 Hyperlipidemia, unspecified: Secondary | ICD-10-CM

## 2019-01-29 DIAGNOSIS — Z2821 Immunization not carried out because of patient refusal: Secondary | ICD-10-CM | POA: Diagnosis not present

## 2019-01-29 DIAGNOSIS — Z0000 Encounter for general adult medical examination without abnormal findings: Secondary | ICD-10-CM

## 2019-01-29 DIAGNOSIS — Z130 Encounter for screening for diseases of the blood and blood-forming organs and certain disorders involving the immune mechanism: Secondary | ICD-10-CM

## 2019-01-29 DIAGNOSIS — Z125 Encounter for screening for malignant neoplasm of prostate: Secondary | ICD-10-CM

## 2019-01-29 DIAGNOSIS — R634 Abnormal weight loss: Secondary | ICD-10-CM | POA: Diagnosis not present

## 2019-01-29 DIAGNOSIS — Z131 Encounter for screening for diabetes mellitus: Secondary | ICD-10-CM

## 2019-01-29 LAB — COMPREHENSIVE METABOLIC PANEL
ALT: 18 U/L (ref 0–53)
AST: 19 U/L (ref 0–37)
Albumin: 4.7 g/dL (ref 3.5–5.2)
Alkaline Phosphatase: 43 U/L (ref 39–117)
BUN: 10 mg/dL (ref 6–23)
CO2: 30 mEq/L (ref 19–32)
Calcium: 9.4 mg/dL (ref 8.4–10.5)
Chloride: 101 mEq/L (ref 96–112)
Creatinine, Ser: 0.75 mg/dL (ref 0.40–1.50)
GFR: 113.14 mL/min (ref >60.00)
Glucose, Bld: 83 mg/dL (ref 70–99)
Potassium: 4.1 mEq/L (ref 3.5–5.1)
Sodium: 138 mEq/L (ref 135–145)
Total Bilirubin: 0.9 mg/dL (ref 0.2–1.2)
Total Protein: 7.3 g/dL (ref 6.0–8.3)

## 2019-01-29 LAB — CBC WITH DIFFERENTIAL/PLATELET
Basophils Absolute: 0.1 10*3/uL (ref 0.0–0.1)
Basophils Relative: 1 % (ref 0.0–3.0)
Eosinophils Absolute: 0 10*3/uL (ref 0.0–0.7)
Eosinophils Relative: 0.7 % (ref 0.0–5.0)
HCT: 45.3 % (ref 39.0–52.0)
Hemoglobin: 15 g/dL (ref 13.0–17.0)
Lymphocytes Relative: 35.8 % (ref 12.0–46.0)
Lymphs Abs: 1.8 10*3/uL (ref 0.7–4.0)
MCHC: 33.1 g/dL (ref 30.0–36.0)
MCV: 97.6 fl (ref 78.0–100.0)
Monocytes Absolute: 0.3 10*3/uL (ref 0.1–1.0)
Monocytes Relative: 7 % (ref 3.0–12.0)
Neutro Abs: 2.7 10*3/uL (ref 1.4–7.7)
Neutrophils Relative %: 55.5 % (ref 43.0–77.0)
Platelets: 243 10*3/uL (ref 150.0–400.0)
RBC: 4.64 Mil/uL (ref 4.22–5.81)
RDW: 12.7 % (ref 11.5–15.5)
WBC: 4.9 10*3/uL (ref 4.0–10.5)

## 2019-01-29 LAB — LIPID PANEL
Cholesterol: 212 mg/dL — ABNORMAL HIGH (ref 0–200)
HDL: 57.9 mg/dL (ref >39.00)
LDL Cholesterol: 145 mg/dL — ABNORMAL HIGH (ref 0–99)
NonHDL: 153.74
Total CHOL/HDL Ratio: 4
Triglycerides: 46 mg/dL (ref 0.0–149.0)
VLDL: 9.2 mg/dL (ref 0.0–40.0)

## 2019-01-29 LAB — PSA: PSA: 0.54 ng/mL (ref 0.10–4.00)

## 2019-01-29 LAB — TSH: TSH: 0.91 u[IU]/mL (ref 0.35–4.50)

## 2019-01-29 LAB — HEMOGLOBIN A1C: Hgb A1c MFr Bld: 5.3 % (ref 4.6–6.5)

## 2019-01-29 NOTE — Patient Instructions (Signed)

## 2019-01-29 NOTE — Progress Notes (Signed)
Patient ID: Craig Burton, male  DOB: 10/02/1975, 43 y.o.   MRN: [id number] Patient Care Team    Relationship Specialty Notifications Start End  Ma Hillock, DO PCP - General Family Medicine  12/29/15     Chief Complaint  Patient presents with  . Annual Exam    fasting. does not want flu shot    Subjective:  Craig Burton is a 43 y.o.  Male  present for CPE. All past medical history, surgical history, allergies, family history, immunizations, medications and social history were updated in the electronic medical record today. All recent labs, ED visits and hospitalizations within the last year were reviewed.  Unintentional weight loss/Raynaud's phenomenon: Patient reports he steadily has been losing weight over the last 2 years unintentionally.  He denies any associated symptoms.  He states he has always been healthy diet.  The only thing that might have changed with his diet is better portion control.  He did increase his exercise temporarily during the beginning of the pandemic in March, but now he rarely exercises.  He states he is starting to get asked by other people if something is wrong, as the cause of his weight loss. Body mass index is 21.22 kg/m.  12/2015 patient weighed 175, 12/2016 patient weighed 172, 11/2017 patient weighed 165, today patient weighs 152. H/o of raynauds-like phenomenon-started in 2018, managed with wearing gloves etc in warm weather.  Father was diagnosed with prostate cancer in his 19s.  Mother with arthritis, presumed osteoarthritis.  Maternal grandmother with diagnosis of arthritis.  No family history of irritable bowel disease, other cancers outside of father with prostate cancer at a late age or autoimmune disorders.  Patient denies any changes in his urinary stream, dysuria, bowel changes, nausea, vomit, fatigue, abdominal pain, fever, chills, cough or night sweats.  He has never been a smoker.  Health maintenance:  Colonoscopy: No fhx, screen at 50  Immunizations: tdap 2013, Influenza declined(encouraged yearly) Infectious disease screening:HIV declined today PSA: Father fhx (80s). No sx. unintentional weight loss.  Assistive device: none Oxygen IOE:VOJJ Patient has a Dental home. Hospitalizations/ED visits: reviewed  Depression screen [facility] 2/9 01/29/2019 01/12/2018 11/29/2017 01/06/2017 12/29/2015  Decreased Interest 0 0 0 0 0  Down, Depressed, Hopeless 0 0 0 0 0  PHQ - 2 Score 0 0 0 0 0   No flowsheet data found.  Immunization History  Administered Date(s) Administered  . Tdap 04/12/2011    Past Medical History:  Diagnosis Date  . Allergy   . Chicken pox   . Hordeolum externum of right eye   . Hyperlipidemia   . Raynaud's syndrome 2018   fingers, very quick recovery, occurs in winter.   Allergies  Allergen Reactions  . Penicillins Hives   Past Surgical History:  Procedure Laterality Date  . VASECTOMY  2011  . WISDOM TOOTH EXTRACTION     Family History  Problem Relation Age of Onset  . Arthritis Mother   . Diabetes Mother   . Prostate cancer Father 49  . Heart disease Father        MI- 76  . Glaucoma Father   . Arthritis Maternal Grandmother   . Diabetes Maternal Grandmother   . Stroke Maternal Grandmother        90  . Glaucoma Maternal Grandmother   . Diabetes Maternal Grandfather    Social History   Social History Narrative   Married to [employer] Veterinary surgeon). 2 Children (Will and [name]).    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    Feels safe in his relationships.     Allergies as of 01/29/2019      Reactions   Penicillins Hives      Medication List       Accurate as of January 29, 2019 12:27 PM. If you have any questions, ask your nurse or doctor.        Fish Oil 1000 MG Caps Take by mouth 2 (two) times daily.       All past medical history, surgical history, allergies, family history, immunizations  andmedications were updated in the EMR today and reviewed under the history and medication portions of their EMR.     No results found for this or any previous visit (from the past 2160 hour(s)).   ROS: 14 pt review of systems performed and negative (unless mentioned in an HPI)  Objective: BP 121/82 (BP Location: Left Arm, Patient Position: Sitting, Cuff Size: Normal)   Pulse (!) 56   Temp 98.4 F (36.9 C) (Temporal)   Resp 16   Ht '5\' 11"'$  (1.803 m)   Wt 152 lb 2 oz (69 kg)   SpO2 100%   BMI 21.22 kg/m  Gen: Afebrile. No acute distress. Nontoxic in appearance, well-developed, well-nourished, pleasant, thin Caucasian male. HENT: AT. [place]. Bilateral TM visualized and normal in appearance, normal external auditory canal. MMM, no oral lesions, adequate dentition. Bilateral nares within normal limits. Throat without erythema, ulcerations or exudates.  No cough on exam, no hoarseness on exam. Eyes:Pupils Equal Round Reactive to light, Extraocular movements intact,  Conjunctiva without redness, discharge or icterus. Neck/lymp/endocrine: Supple, no lymphadenopathy, no thyromegaly CV: RRR no murmur, no edema, +2/4 P posterior tibialis pulses.  No carotid bruits. No JVD. Chest: CTAB, no wheeze, rhonchi or crackles.  Normal respiratory effort.  Good air movement. Abd: Soft.  Flat. NTND. BS present.  No masses palpated. No hepatosplenomegaly. No rebound tenderness or guarding. Skin: No rashes, purpura or petechiae. Warm and well-perfused. Skin intact. Neuro/Msk:  Normal gait. PERLA. EOMi. Alert. Oriented x3.  Cranial nerves II through XII intact. Muscle strength 5/5 upper/lower extremity. DTRs equal bilaterally. Psych: Normal affect, dress and demeanor. Normal speech. Normal thought content and judgment.  No exam data present  Assessment/plan: Emet Rafanan is a 43 y.o. male present for CPE Elevated lipids - currently taking fish oil only.  - Comprehensive metabolic panel - Lipid panel - TSH  Vaccination not carried out because of patient refusal Declined flu shot.  Diabetes mellitus screening - Hemoglobin A1c Screening for deficiency anemia - CBC with Differential/Platelet Unintentional weight loss/Raynaud's phenomenon Patient has averaged a loss of about 10 pounds a year over the last 2 years for a 20 pound weight loss.  His weight loss is unintentional and without cause.  He states outside of mild portion control he has not changed anything over this time.  He has no associated symptoms, other than development of a Raynaud's phenomena in 2018 in which he has 2-3 episodes a year over the last 2 years in cold weather. - TSH - PSA -Start with thyroid and PSA today.   - Consider autoimmune work-up ANA, ESR, CRP if labs are normal and he desires further work up or he loses additional 5 pounds. - Calorie track- ensure consuming enough calories to maintain weight (calorie calculator- and phone app to track).  Prostate cancer screening/Family history of  benign neoplasm of prostate FHX in father at later age, so sx- elected to early screen secondary to fhx and unintentional weight loss to be safe. No urinary sx/changes.  - PSA Encounter for preventive health examination Patient was encouraged to exercise greater than 150 minutes a week. Patient was encouraged to choose a diet filled with fresh fruits and vegetables, and lean meats. AVS provided to patient today for education/recommendation on gender specific health and safety maintenance. Colonoscopy: No fhx, screen at 50 Immunizations: tdap 2013, Influenza declined(encouraged yearly) Infectious disease screening:HIV declined today PSA: Father fhx (80s). No sx. unintentional weight loss.   Return in about 1 year (around 01/29/2020) for CPE (30 min).  Electronically signed by: Howard Pouch, DO [place]

## 2019-01-30 ENCOUNTER — Telehealth: Payer: Self-pay | Admitting: Family Medicine

## 2019-01-30 NOTE — Telephone Encounter (Signed)
Please inform patient the following information: - Patient's electrolytes, liver, kidney and thyroid function are normal. - Cell counts and his blood are normal. -Diabetes screen/A1c is normal. -Prostate cancer screening is normal/negative. -Cholesterol is about the same as last year, a little better with a LDL/bad cholesterol 146 this year, was 156 last year.  Less than 130 would be more ideal, however no added medications are needed at this time. -For his unintentional weight loss-I would recommend he start calorie counting to ensure he is getting enough calories daily  to maintain his weight.  I would not recommend any additional weight loss with a BMI of 21.  Calorie counting  will rule out lack of calories as the cause of his unintentional weight loss.     -There are Internet sites to calculate daily caloric needs which is based on gender, weight, age and activity.  One  is called calorie calculator.  I would recommend he calculate his daily caloric need by using the calorie calculator and then tracking his calories via phone app such as my fitness pal.    -In addition, I would recommend if he loses an additional 5 pounds unintentionally, to follow-up so we can pursue further work-up on cause.  There are certain autoimmune disorders that are potential causes that may also be associated with the Raynaud's he has experienced.

## 2019-11-29 DIAGNOSIS — Z20822 Contact with and (suspected) exposure to covid-19: Secondary | ICD-10-CM | POA: Diagnosis not present

## 2020-02-03 ENCOUNTER — Ambulatory Visit: Payer: BC Managed Care – PPO | Admitting: Family Medicine

## 2020-02-04 ENCOUNTER — Encounter: Payer: Self-pay | Admitting: Family Medicine

## 2020-02-04 ENCOUNTER — Other Ambulatory Visit: Payer: Self-pay

## 2020-02-04 ENCOUNTER — Telehealth: Payer: Self-pay | Admitting: Family Medicine

## 2020-02-04 ENCOUNTER — Ambulatory Visit (INDEPENDENT_AMBULATORY_CARE_PROVIDER_SITE_OTHER): Payer: BC Managed Care – PPO | Admitting: Family Medicine

## 2020-02-04 VITALS — BP 122/81 | HR 78 | Temp 97.7°F | Ht 70.0 in | Wt 164.0 lb

## 2020-02-04 DIAGNOSIS — Z130 Encounter for screening for diseases of the blood and blood-forming organs and certain disorders involving the immune mechanism: Secondary | ICD-10-CM | POA: Diagnosis not present

## 2020-02-04 DIAGNOSIS — Z0000 Encounter for general adult medical examination without abnormal findings: Secondary | ICD-10-CM | POA: Diagnosis not present

## 2020-02-04 DIAGNOSIS — E785 Hyperlipidemia, unspecified: Secondary | ICD-10-CM | POA: Diagnosis not present

## 2020-02-04 DIAGNOSIS — Z1211 Encounter for screening for malignant neoplasm of colon: Secondary | ICD-10-CM | POA: Diagnosis not present

## 2020-02-04 DIAGNOSIS — Z1159 Encounter for screening for other viral diseases: Secondary | ICD-10-CM

## 2020-02-04 DIAGNOSIS — Z131 Encounter for screening for diabetes mellitus: Secondary | ICD-10-CM | POA: Diagnosis not present

## 2020-02-04 DIAGNOSIS — Z125 Encounter for screening for malignant neoplasm of prostate: Secondary | ICD-10-CM

## 2020-02-04 DIAGNOSIS — Z8489 Family history of other specified conditions: Secondary | ICD-10-CM | POA: Diagnosis not present

## 2020-02-04 LAB — CBC WITH DIFFERENTIAL/PLATELET
Basophils Absolute: 0.1 10*3/uL (ref 0.0–0.1)
Basophils Relative: 1 % (ref 0.0–3.0)
Eosinophils Absolute: 0 10*3/uL (ref 0.0–0.7)
Eosinophils Relative: 0.9 % (ref 0.0–5.0)
HCT: 44.9 % (ref 39.0–52.0)
Hemoglobin: 15.1 g/dL (ref 13.0–17.0)
Lymphocytes Relative: 32.4 % (ref 12.0–46.0)
Lymphs Abs: 1.6 10*3/uL (ref 0.7–4.0)
MCHC: 33.6 g/dL (ref 30.0–36.0)
MCV: 96.8 fl (ref 78.0–100.0)
Monocytes Absolute: 0.4 10*3/uL (ref 0.1–1.0)
Monocytes Relative: 9.1 % (ref 3.0–12.0)
Neutro Abs: 2.8 10*3/uL (ref 1.4–7.7)
Neutrophils Relative %: 56.6 % (ref 43.0–77.0)
Platelets: 245 10*3/uL (ref 150.0–400.0)
RBC: 4.64 Mil/uL (ref 4.22–5.81)
RDW: 12.8 % (ref 11.5–15.5)
WBC: 4.9 10*3/uL (ref 4.0–10.5)

## 2020-02-04 LAB — LIPID PANEL
Cholesterol: 266 mg/dL — ABNORMAL HIGH (ref 0–200)
HDL: 71.6 mg/dL (ref >39.00)
LDL Cholesterol: 181 mg/dL — ABNORMAL HIGH (ref 0–99)
NonHDL: 193.98
Total CHOL/HDL Ratio: 4
Triglycerides: 67 mg/dL (ref 0.0–149.0)
VLDL: 13.4 mg/dL (ref 0.0–40.0)

## 2020-02-04 LAB — COMPREHENSIVE METABOLIC PANEL
ALT: 22 U/L (ref 0–53)
AST: 24 U/L (ref 0–37)
Albumin: 4.6 g/dL (ref 3.5–5.2)
Alkaline Phosphatase: 42 U/L (ref 39–117)
BUN: 12 mg/dL (ref 6–23)
CO2: 31 mEq/L (ref 19–32)
Calcium: 9.4 mg/dL (ref 8.4–10.5)
Chloride: 97 mEq/L (ref 96–112)
Creatinine, Ser: 0.76 mg/dL (ref 0.40–1.50)
GFR: 108.93 mL/min (ref >60.00)
Glucose, Bld: 75 mg/dL (ref 70–99)
Potassium: 4.4 mEq/L (ref 3.5–5.1)
Sodium: 137 mEq/L (ref 135–145)
Total Bilirubin: 1 mg/dL (ref 0.2–1.2)
Total Protein: 7.1 g/dL (ref 6.0–8.3)

## 2020-02-04 LAB — PSA: PSA: 0.79 ng/mL (ref 0.10–4.00)

## 2020-02-04 LAB — HEMOGLOBIN A1C: Hgb A1c MFr Bld: 5.4 % (ref 4.6–6.5)

## 2020-02-04 NOTE — Telephone Encounter (Signed)
Please call patient Liver, kidney and thyroid function are normal Blood cell counts and electrolytes are normal Diabetes screening/A1c is normal at 5.4 Cholesterol panel is above goal. His good cholesterol/HDL looks great at 71, but his LDL/ bad cholesterol is very high at 181.  For him,> 44 yo male with a family history of heart disease and an LDL greater than 160 places him at an increased cardiovascular risk for MI/stroke. His goal LDL should be at least <130.    - I would recommend starting a statin medication to help lower cholesterol and provided cardiovascular protection. I would also recommend increasing exercise and follow a mediterranean diet.  A mediterranean diet is high in fruits, vegetables, whole grains, fish, chicken, nuts, healthy fats (olive oil or canola oil). Low fat dairy.Limit butter, margarine, red meat and sweets.   If agreeable to start statin medication we will call this in for him and follow up with provider in 3 mos for recheck.  Please advise of his decision.   thanks

## 2020-02-04 NOTE — Progress Notes (Signed)
This visit occurred during the SARS-CoV-2 public health emergency.  Safety protocols were in place, including screening questions prior to the visit, additional usage of staff PPE, and extensive cleaning of exam room while observing appropriate contact time as indicated for disinfecting solutions.    Patient ID: Craig Burton, male  DOB: [birth year], 44 y.o.   MRN: [id number] Patient Care Team    Relationship Specialty Notifications Start End  Natalia Leatherwood, DO PCP - General Family Medicine  12/29/15     Chief Complaint  Patient presents with  . Annual Exam    pt is fasting    Subjective: Craig Burton is a 44 y.o. male present for CPE. All past medical history, surgical history, allergies, family history, immunizations, medications and social history were updated in the electronic medical record today. All recent labs, ED visits and hospitalizations within the last year were reviewed.  Health maintenance:  Colonoscopy: No fhx, screen at 28- referred today Immunizations: tdap 2013, Influenza declined(encouraged yearly), covid series completed.  Infectious disease screening:HIVdeclined QJJ:HERDEY fhx (80s). No sx. unintentional weight loss.  PSA:  Lab Results  Component Value Date   PSA 0.79 02/04/2020   PSA 0.54 01/29/2019  , pt was counseled on prostate cancer screenings.  Assistive device: none Oxygen CXK:GYJE Patient has a Dental home. Hospitalizations/ED visits: reviewed  Depression screen [facility] 2/9 02/04/2020 01/29/2019 01/12/2018 11/29/2017 01/06/2017  Decreased Interest 0 0 0 0 0  Down, Depressed, Hopeless 0 0 0 0 0  PHQ - 2 Score 0 0 0 0 0   No flowsheet data found.     Fall Risk  11/29/2017 12/29/2015  Falls in the past year? No No    Immunization History  Administered Date(s) Administered  . PFIZER SARS-COV-2 Vaccination 06/19/2019, 07/15/2019  . Tdap 04/12/2011   Past Medical History:  Diagnosis Date  . Allergy   . Chicken pox   . Hordeolum externum of  right eye   . Hyperlipidemia   . Raynaud's syndrome 2018   fingers, very quick recovery, occurs in winter.   Allergies  Allergen Reactions  . Penicillins Hives   Past Surgical History:  Procedure Laterality Date  . VASECTOMY  2011  . WISDOM TOOTH EXTRACTION     Family History  Problem Relation Age of Onset  . Arthritis Mother   . Diabetes Mother   . Prostate cancer Father 94       died at 58 from prostate cancer  . Heart disease Father        MI- 72  . Glaucoma Father   . Arthritis Maternal Grandmother   . Diabetes Maternal Grandmother   . Stroke Maternal Grandmother        90  . Glaucoma Maternal Grandmother   . Diabetes Maternal Grandfather    Social History   Social History Narrative   Married to [employer] Industrial/product designer). 2 Children (Will and [name]).    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    Feels safe in his relationships.     Allergies as of 02/04/2020      Reactions   Penicillins Hives      Medication List       Accurate as of February 04, 2020 11:59 PM. If you have any questions, ask your nurse or doctor.        Fish Oil 1000 MG Caps Take by mouth 2 (two) times daily.  All past medical history, surgical history, allergies, family history, immunizations andmedications were updated in the EMR today and reviewed under the history and medication portions of their EMR.       ROS: 14 pt review of systems performed and negative (unless mentioned in an HPI)  Objective: BP 122/81   Pulse 78   Temp 97.7 F (36.5 C) (Oral)   Ht 5\' 10"  (1.778 m)   Wt 164 lb (74.4 kg)   SpO2 99%   BMI 23.53 kg/m  Gen: Afebrile. No acute distress. Nontoxic in appearance, well-developed, well-nourished, pleasant male HENT: AT. [place]. Bilateral TM visualized and normal in appearance, normal external auditory canal. MMM, no oral lesions, adequate dentition. Bilateral nares within normal limits.  Throat without erythema, ulcerations or exudates.  No cough on exam, no hoarseness on exam. Eyes:Pupils Equal Round Reactive to light, Extraocular movements intact,  Conjunctiva without redness, discharge or icterus. Neck/lymp/endocrine: Supple, no lymphadenopathy, no thyromegaly CV: RRR no murmur, no edema, +2/4 P posterior tibialis pulses.  Chest: CTAB, no wheeze, rhonchi or crackles.  Normal respiratory effort.  Good air movement. Abd: Soft.  Flat. NTND. BS present.  No masses palpated. No hepatosplenomegaly. No rebound tenderness or guarding. Skin: No rashes, purpura or petechiae. Warm and well-perfused. Skin intact. Neuro/Msk:  Normal gait. PERLA. EOMi. Alert. Oriented x3.  Cranial nerves II through XII intact. Muscle strength 5/5 upper/lower extremity. DTRs equal bilaterally. Psych: Normal affect, dress and demeanor. Normal speech. Normal thought content and judgment.  No exam data present  Assessment/plan: Craig Burton is a 44 y.o. male present for CPE Colon cancer screening - Ambulatory referral to Gastroenterology Elevated lipids/FH heart disease in Father - Comprehensive metabolic panel - Lipid panel Prostate cancer screening/Family history of benign neoplasm of prostate - PSA Diabetes mellitus screening - Hemoglobin A1c Screening for deficiency anemia - CBC with Differential/Platelet Need for hepatitis C screening test - Hepatitis C Antibody Encounter for preventive health examination Patient was encouraged to exercise greater than 150 minutes a week. Patient was encouraged to choose a diet filled with fresh fruits and vegetables, and lean meats. AVS provided to patient today for education/recommendation on gender specific health and safety maintenance. Colonoscopy: No fhx, screen at 58- referred today Immunizations: tdap 2013, Influenza declined(encouraged yearly), covid series completed.  Infectious disease screening:HIVdeclined 2014 fhx (80s). No sx.  unintentional weight loss. ordered today  Return in about 1 year (around 02/03/2021) for CPE (30 min).  Unless lab results indicate a need to be seen sooner.   Orders Placed This Encounter  Procedures  . CBC with Differential/Platelet  . Comprehensive metabolic panel  . Hemoglobin A1c  . PSA  . Lipid panel  . Hepatitis C Antibody  . Ambulatory referral to Gastroenterology   No orders of the defined types were placed in this encounter.   Referral Orders     Ambulatory referral to Gastroenterology   Note is dictated utilizing voice recognition software. Although note has been proof read prior to signing, occasional typographical errors still can be missed. If any questions arise, please do not hesitate to call for verification.  Electronically signed by: 02/05/2021, DO [facility] [place]

## 2020-02-04 NOTE — Patient Instructions (Signed)

## 2020-02-05 LAB — HEPATITIS C ANTIBODY
Hepatitis C Ab: NONREACTIVE
SIGNAL TO CUT-OFF: 0.01 (ref <1.00)

## 2020-02-05 NOTE — Telephone Encounter (Signed)
Patient advised and voiced understanding. He had concerns regarding statin medication as far as if it would be indefinitely or just for a certain time frame.   Please advise and can reach back out to patient to provide better clarification.

## 2020-02-05 NOTE — Telephone Encounter (Signed)
Statin medication to lower cholesterol and provide cardiovascular protection is typically needed by the patient indefinitely.  However, that is not to say that after dietary changes and increasing his exercise that he may lower his cholesterol enough that we could consider coming off medication.

## 2020-02-05 NOTE — Telephone Encounter (Signed)
LM for pt to returncall

## 2020-02-06 MED ORDER — ATORVASTATIN CALCIUM 20 MG PO TABS: 20.0000 mg | ORAL_TABLET | Freq: Every day | ORAL | 3 refills | Status: DC

## 2020-02-06 NOTE — Telephone Encounter (Signed)
Lipitor prescribed

## 2020-02-06 NOTE — Addendum Note (Signed)
Addended by: Felix Pacini A on: 02/06/2020 12:32 PM   Modules accepted: Orders

## 2020-02-06 NOTE — Telephone Encounter (Signed)
Patient was advised of further instructions. He is agreeable to starting statin medication. 3 month provider appointment has been scheduled as well. Please send in medication to [facility].

## 2020-02-19 ENCOUNTER — Encounter: Payer: Self-pay | Admitting: Family Medicine

## 2020-04-15 ENCOUNTER — Other Ambulatory Visit: Payer: Self-pay

## 2020-04-15 ENCOUNTER — Ambulatory Visit (AMBULATORY_SURGERY_CENTER): Payer: Self-pay | Admitting: *Deleted

## 2020-04-15 VITALS — Ht 70.0 in | Wt 166.6 lb

## 2020-04-15 DIAGNOSIS — Z1211 Encounter for screening for malignant neoplasm of colon: Secondary | ICD-10-CM

## 2020-04-15 MED ORDER — SUTAB 1479-225-188 MG PO TABS: 1.0000 | ORAL_TABLET | Freq: Once | ORAL | 0 refills | Status: AC

## 2020-04-15 NOTE — Progress Notes (Signed)
Completed covid vaccines x3  Pt is aware that care partner will wait in the car during procedure; if they feel like they will be too hot or cold to wait in the car; they may wait in the 4 th floor lobby. Patient is aware to bring only one care partner. We want them to wear a mask (we do not have any that we can provide them), practice social distancing, and we will check their temperatures when they get here.  I did remind the patient that their care partner needs to stay in the parking lot the entire time and have a cell phone available, we will call them when the pt is ready for discharge. Patient will wear mask into building.   No trouble with anesthesia, denies difficulty moving neck, or hx/fam hx of malignant hyperthermia per pt   No egg or soy allergy  No home oxygen use   No medications for weight loss taken  emmi information given  Pt denies constipation issues   Sutab code put into RX and paper copy given to pt to show pharmacy

## 2020-04-23 ENCOUNTER — Encounter: Payer: Self-pay | Admitting: Gastroenterology

## 2020-04-29 ENCOUNTER — Other Ambulatory Visit: Payer: Self-pay

## 2020-04-29 ENCOUNTER — Encounter: Payer: Self-pay | Admitting: Gastroenterology

## 2020-04-29 ENCOUNTER — Ambulatory Visit (AMBULATORY_SURGERY_CENTER): Payer: BC Managed Care – PPO | Admitting: Gastroenterology

## 2020-04-29 VITALS — BP 131/70 | HR 65 | Temp 97.3°F | Resp 20 | Ht 70.0 in | Wt 166.0 lb

## 2020-04-29 DIAGNOSIS — Z1211 Encounter for screening for malignant neoplasm of colon: Secondary | ICD-10-CM

## 2020-04-29 MED ORDER — SODIUM CHLORIDE 0.9 % IV SOLN: 500.0000 mL | Freq: Once | INTRAVENOUS | Status: DC

## 2020-04-29 NOTE — Op Note (Signed)
[facility] Patient Name: Craig Burton Procedure Date: 04/29/2020 10:10 AM MRN: [id number] Endoscopist: Viviann Spare P. Adela Lank , MD Age: 45 Referring MD:  Date of Birth: Jun 07, 1975 Gender: Male Account #: [id number] Procedure:                Colonoscopy Indications:              Screening for colorectal malignant neoplasm, This                            is the patient's first colonoscopy Medicines:                Monitored Anesthesia Care Procedure:                Pre-Anesthesia Assessment:                           - Prior to the procedure, a History and Physical                            was performed, and patient medications and                            allergies were reviewed. The patient's tolerance of                            previous anesthesia was also reviewed. The risks                            and benefits of the procedure and the sedation                            options and risks were discussed with the patient.                            All questions were answered, and informed consent                            was obtained. Prior Anticoagulants: The patient has                            taken no previous anticoagulant or antiplatelet                            agents. ASA Grade Assessment: II - A patient with                            mild systemic disease. After reviewing the risks                            and benefits, the patient was deemed in                            satisfactory condition to undergo the procedure.  After obtaining informed consent, the colonoscope                            was passed under direct vision. Throughout the                            procedure, the patient's blood pressure, pulse, and                            oxygen saturations were monitored continuously. The                            Colonoscope was introduced through the anus and                            advanced to the the  terminal ileum, with                            identification of the appendiceal orifice and IC                            valve. The colonoscopy was performed without                            difficulty. The patient tolerated the procedure                            well. The quality of the bowel preparation was                            good. The ileocecal valve, appendiceal orifice, and                            rectum were photographed. Scope In: 10:21:09 AM Scope Out: 10:39:44 AM Scope Withdrawal Time: 0 hours 14 minutes 52 seconds  Total Procedure Duration: 0 hours 18 minutes 35 seconds  Findings:                 The perianal and digital rectal examinations were                            normal.                           The terminal ileum appeared normal.                           The colon appeared normal. No polyps or                            abnormalities appreciated. Complications:            No immediate complications. Estimated blood loss:                            None. Estimated Blood Loss:  Estimated blood loss: none. Impression:               - The examined portion of the ileum was normal.                           - The entire examined colon is normal.                           - No polyps. Recommendation:           - Patient has a contact number available for                            emergencies. The signs and symptoms of potential                            delayed complications were discussed with the                            patient. Return to normal activities tomorrow.                            Written discharge instructions were provided to the                            patient.                           - Resume previous diet.                           - Continue present medications.                           - Repeat colonoscopy in 10 years for screening                            purposes. Viviann Spare P. Clarissia Mckeen, MD 04/29/2020 10:43:55  AM This report has been signed electronically.

## 2020-04-29 NOTE — Patient Instructions (Signed)
Resume previous diet Continue current medications Repeat colonoscopy in 10 years!!!!!!  YOU HAD AN ENDOSCOPIC PROCEDURE TODAY AT THE [facility]:   Refer to the procedure report that was given to you for any specific questions about what was found during the examination.  If the procedure report does not answer your questions, please call your gastroenterologist to clarify.  If you requested that your care partner not be given the details of your procedure findings, then the procedure report has been included in a sealed envelope for you to review at your convenience later.  YOU SHOULD EXPECT: Some feelings of bloating in the abdomen. Passage of more gas than usual.  Walking can help get rid of the air that was put into your GI tract during the procedure and reduce the bloating. If you had a lower endoscopy (such as a colonoscopy or flexible sigmoidoscopy) you may notice spotting of blood in your stool or on the toilet paper. If you underwent a bowel prep for your procedure, you may not have a normal bowel movement for a few days.  Please Note:  You might notice some irritation and congestion in your nose or some drainage.  This is from the oxygen used during your procedure.  There is no need for concern and it should clear up in a day or so.  SYMPTOMS TO REPORT IMMEDIATELY:  Following lower endoscopy (colonoscopy or flexible sigmoidoscopy):  Excessive amounts of blood in the stool  Significant tenderness or worsening of abdominal pains  Swelling of the abdomen that is new, acute  Fever of 100F or higher  For urgent or emergent issues, a gastroenterologist can be reached at any hour by calling [phone]. Do not use MyChart messaging for urgent concerns.   DIET:  We do recommend a small meal at first, but then you may proceed to your regular diet.  Drink plenty of fluids but you should avoid alcoholic beverages for 24 hours.  ACTIVITY:  You should plan to take it easy for the  rest of today and you should NOT DRIVE or use heavy machinery until tomorrow (because of the sedation medicines used during the test).    FOLLOW UP: Our staff will call the number listed on your records 48-72 hours following your procedure to check on you and address any questions or concerns that you may have regarding the information given to you following your procedure. If we do not reach you, we will leave a message.  We will attempt to reach you two times.  During this call, we will ask if you have developed any symptoms of COVID 19. If you develop any symptoms (ie: fever, flu-like symptoms, shortness of breath, cough etc.) before then, please call [phone].  If you test positive for Covid 19 in the 2 weeks post procedure, please call and report this information to us.    If any biopsies were taken you will be contacted by phone or by letter within the next 1-3 weeks.  Please call us at [phone] if you have not heard about the biopsies in 3 weeks.   SIGNATURES/CONFIDENTIALITY: You and/or your care partner have signed paperwork which will be entered into your electronic medical record.  These signatures attest to the fact that that the information above on your After Visit Summary has been reviewed and is understood.  Full responsibility of the confidentiality of this discharge information lies with you and/or your care-partner.  

## 2020-04-29 NOTE — Progress Notes (Signed)
VS-CW  Pt's states no medical or surgical changes since previsit or office visit.  

## 2020-04-29 NOTE — Progress Notes (Signed)
To PACU, VSS. Report to [email or web address] 

## 2020-05-01 ENCOUNTER — Telehealth: Payer: Self-pay

## 2020-05-01 NOTE — Telephone Encounter (Signed)
°  Follow up Call-  Call back number 04/29/2020  Post procedure Call Back phone  # [phone]  Permission to leave phone message Yes  Some recent data might be hidden     Patient questions:  Do you have a fever, pain , or abdominal swelling? No. Pain Score  0 *  Have you tolerated food without any problems? Yes.    Have you been able to return to your normal activities? Yes.    Do you have any questions about your discharge instructions: Diet   No. Medications  No. Follow up visit  No.  Do you have questions or concerns about your Care? No.  Actions: * If pain score is 4 or above: No action needed, pain <4.  1. Have you developed a fever since your procedure? no  2.   Have you had an respiratory symptoms (SOB or cough) since your procedure? no  3.   Have you tested positive for COVID 19 since your procedure no  4.   Have you had any family members/close contacts diagnosed with the COVID 19 since your procedure?  no   If yes to any of these questions please route to Laverna Peace, RN and Karlton Lemon, RN

## 2020-05-08 ENCOUNTER — Other Ambulatory Visit: Payer: Self-pay

## 2020-05-08 ENCOUNTER — Encounter: Payer: Self-pay | Admitting: Family Medicine

## 2020-05-08 ENCOUNTER — Ambulatory Visit: Payer: BC Managed Care – PPO | Admitting: Family Medicine

## 2020-05-08 VITALS — BP 118/73 | HR 59 | Temp 98.0°F | Ht 70.0 in | Wt 167.0 lb

## 2020-05-08 DIAGNOSIS — Z79899 Other long term (current) drug therapy: Secondary | ICD-10-CM

## 2020-05-08 DIAGNOSIS — E782 Mixed hyperlipidemia: Secondary | ICD-10-CM

## 2020-05-08 LAB — COMPREHENSIVE METABOLIC PANEL
ALT: 23 U/L (ref 0–53)
AST: 22 U/L (ref 0–37)
Albumin: 4.6 g/dL (ref 3.5–5.2)
Alkaline Phosphatase: 48 U/L (ref 39–117)
BUN: 14 mg/dL (ref 6–23)
CO2: 31 mEq/L (ref 19–32)
Calcium: 9.5 mg/dL (ref 8.4–10.5)
Chloride: 101 mEq/L (ref 96–112)
Creatinine, Ser: 0.79 mg/dL (ref 0.40–1.50)
GFR: 107.47 mL/min (ref >60.00)
Glucose, Bld: 75 mg/dL (ref 70–99)
Potassium: 4.2 mEq/L (ref 3.5–5.1)
Sodium: 138 mEq/L (ref 135–145)
Total Bilirubin: 1.1 mg/dL (ref 0.2–1.2)
Total Protein: 7.2 g/dL (ref 6.0–8.3)

## 2020-05-08 LAB — LIPID PANEL
Cholesterol: 151 mg/dL (ref 0–200)
HDL: 52.4 mg/dL (ref >39.00)
LDL Cholesterol: 90 mg/dL (ref 0–99)
NonHDL: 98.88
Total CHOL/HDL Ratio: 3
Triglycerides: 46 mg/dL (ref 0.0–149.0)
VLDL: 9.2 mg/dL (ref 0.0–40.0)

## 2020-05-08 NOTE — Progress Notes (Signed)
This visit occurred during the SARS-CoV-2 public health emergency.  Safety protocols were in place, including screening questions prior to the visit, additional usage of staff PPE, and extensive cleaning of exam room while observing appropriate contact time as indicated for disinfecting solutions.    Craig Burton , [birth year], 45 y.o., male MRN: [id number] Patient Care Team    Relationship Specialty Notifications Start End  Natalia Leatherwood, DO PCP - General Family Medicine  12/29/15     Chief Complaint  Patient presents with  . Follow-up    CMC; pt is fasting      Subjective: Pt presents for an OV follow up on Hyperlipidemia/statin therapy: Pt started statin 3 mos ago. Tolerating liptior.  He exercises routinely- but he has been more consistent.  Fhx heart disease in father and stroke in [place].Body mass index is 23.96 kg/m. Goal LDL at least below 130.   Lipid Panel     Component Value Date/Time   CHOL 266 (H) 02/04/2020 1155   TRIG 67.0 02/04/2020 1155   HDL 71.60 02/04/2020 1155   CHOLHDL 4 02/04/2020 1155   VLDL 13.4 02/04/2020 1155   LDLCALC 181 (H) 02/04/2020 1155     Depression screen PHQ 2/9 02/04/2020 01/29/2019 01/12/2018 11/29/2017 01/06/2017  Decreased Interest 0 0 0 0 0  Down, Depressed, Hopeless 0 0 0 0 0  PHQ - 2 Score 0 0 0 0 0    Allergies  Allergen Reactions  . Penicillins Hives   Social History   Social History Narrative   Married to [place]). 2 Children (Will and [name]).    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    Feels safe in his relationships.    Past Medical History:  Diagnosis Date  . Allergy   . Chicken pox   . Hordeolum externum of right eye   . Hyperlipidemia   . Raynaud's syndrome 2018   fingers, very quick recovery, occurs in winter.   Past Surgical History:  Procedure Laterality Date  . VASECTOMY  2011  . WISDOM TOOTH EXTRACTION      Family History  Problem Relation Age of Onset  . Arthritis Mother   . Diabetes Mother   . Prostate cancer Father 46       died at 32 from prostate cancer  . Heart disease Father        MI- 9  . Glaucoma Father   . Arthritis Maternal Grandmother   . Diabetes Maternal Grandmother   . Stroke Maternal Grandmother        90  . Glaucoma Maternal Grandmother   . Diabetes Maternal Grandfather   . Colon cancer Neg Hx   . Esophageal cancer Neg Hx   . Rectal cancer Neg Hx   . Stomach cancer Neg Hx    Allergies as of 05/08/2020      Reactions   Penicillins Hives      Medication List       Accurate as of May 08, 2020  9:01 AM. If you have any questions, ask your nurse or doctor.        atorvastatin 20 MG tablet Commonly known as: LIPITOR Take 1 tablet (20 mg total) by mouth daily.   Fish Oil 1000 MG Caps Take by mouth 2 (two) times daily.       All past medical history, surgical history, allergies, family history, immunizations  andmedications were updated in the EMR today and reviewed under the history and medication portions of their EMR.     ROS: Negative, with the exception of above mentioned in HPI   Objective:  BP 118/73   Pulse (!) 59   Temp 98 F (36.7 C) (Oral)   Ht 5\' 10"  (1.778 m)   Wt 167 lb (75.8 kg)   SpO2 100%   BMI 23.96 kg/m  Body mass index is 23.96 kg/m. Gen: Afebrile. No acute distress. Nontoxic in appearance, well developed, well nourished.  HENT: AT. [place].  Eyes:Pupils Equal Round Reactive to light, Extraocular movements intact,  Conjunctiva without redness, discharge or icterus. CV: RRR Chest: CTAB, no wheeze or crackles. Good air movement, normal resp effort.  Skin: no rashes, purpura or petechiae.  Neuro: Normal gait. PERLA. EOMi. Alert. Oriented x3  Psych: Normal affect, dress and demeanor. Normal speech. Normal thought content and judgment.  No exam data present No results found. No results found for this or any previous visit  (from the past 24 hour(s)).  Assessment/Plan: Gloyd Happ is a 45 y.o. male present for OV for  Mixed hyperlipidemia/ On statin therapy - tolerating statin - continue lipitor 20 mg, unless lab results today indicate need to increase.  - continue daily exercise and dietary changes.  - Lipid panel - Comprehensive metabolic panel   Reviewed expectations re: course of current medical issues.  Discussed self-management of symptoms.  Outlined signs and symptoms indicating need for more acute intervention.  Patient verbalized understanding and all questions were answered.  Patient received an After-Visit Summary.    Orders Placed This Encounter  Procedures  . Lipid panel  . Comprehensive metabolic panel   No orders of the defined types were placed in this encounter.  Referral Orders  No referral(s) requested today     Note is dictated utilizing voice recognition software. Although note has been proof read prior to signing, occasional typographical errors still can be missed. If any questions arise, please do not hesitate to call for verification.   electronically signed by:  54, DO  [facility] [place]

## 2020-05-08 NOTE — Patient Instructions (Addendum)
We will call you with lab results.   Preventing High Cholesterol Cholesterol is a white, waxy substance similar to fat that the human body needs to help build cells. The liver makes all the cholesterol that a person's body needs. Having high cholesterol (hypercholesterolemia) increases your risk for heart disease and stroke. Extra or excess cholesterol comes from the food that you eat. High cholesterol can often be prevented with diet and lifestyle changes. If you already have high cholesterol, you can control it with diet, lifestyle changes, and medicines. How can high cholesterol affect me? If you have high cholesterol, fatty deposits (plaques) may build up on the walls of your blood vessels. The blood vessels that carry blood away from your heart are called arteries. Plaques make the arteries narrower and stiffer. This in turn can:  Restrict or block blood flow and cause blood clots to form.  Increase your risk for heart attack and stroke. What can increase my risk for high cholesterol? This condition is more likely to develop in people who:  Eat foods that are high in saturated fat or cholesterol. Saturated fat is mostly found in foods that come from animal sources.  Are overweight.  Are not getting enough exercise.  Have a family history of high cholesterol (familial hypercholesterolemia). What actions can I take to prevent this? Nutrition  Eat less saturated fat.  Avoid trans fats (partially hydrogenated oils). These are often found in margarine and in some baked goods, fried foods, and snacks bought in packages.  Avoid precooked or cured meat, such as bacon, sausages, or meat loaves.  Avoid foods and drinks that have added sugars.  Eat more fruits, vegetables, and whole grains.  Choose healthy sources of protein, such as fish, poultry, lean cuts of red meat, beans, peas, lentils, and nuts.  Choose healthy sources of fat, such as: ? Nuts. ? Vegetable oils, especially  olive oil. ? Fish that have healthy fats, such as omega-3 fatty acids. These fish include mackerel or salmon.   Lifestyle  Lose weight if you are overweight. Maintaining a healthy body mass index (BMI) can help prevent or control high cholesterol. It can also lower your risk for diabetes and high blood pressure. Ask your health care provider to help you with a diet and exercise plan to lose weight safely.  Do not use any products that contain nicotine or tobacco, such as cigarettes, e-cigarettes, and chewing tobacco. If you need help quitting, ask your health care provider. Alcohol use  Do not drink alcohol if: ? Your health care provider tells you not to drink. ? You are pregnant, may be pregnant, or are planning to become pregnant.  If you drink alcohol: ? Limit how much you use to:  0-1 drink a day for women.  0-2 drinks a day for men. ? Be aware of how much alcohol is in your drink. In the [place] one drink equals one 12 oz bottle of beer (355 mL), one 5 oz glass of wine (148 mL), or one 1 oz glass of hard liquor (44 mL). Activity  Get enough exercise. Do exercises as told by your health care provider.  Each week, do at least 150 minutes of exercise that takes a medium level of effort (moderate-intensity exercise). This kind of exercise: ? Makes your heart beat faster while allowing you to still be able to talk. ? Can be done in short sessions several times a day or longer sessions a few times a week. For example, on 5  days each week, you could walk fast or ride your bike 3 times a day for 10 minutes each time.   Medicines  Your health care provider may recommend medicines to help lower cholesterol. This may be a medicine to lower the amount of cholesterol that your liver makes. You may need medicine if: ? Diet and lifestyle changes have not lowered your cholesterol enough. ? You have high cholesterol and other risk factors for heart disease or stroke.  Take over-the-counter and  prescription medicines only as told by your health care provider. General information  Manage your risk factors for high cholesterol. Talk with your health care provider about all your risk factors and how to lower your risk.  Manage other conditions that you have, such as diabetes or high blood pressure (hypertension).  Have blood tests to check your cholesterol levels at regular points in time as told by your health care provider.  Keep all follow-up visits as told by your health care provider. This is important. Where to find more information  American Heart Association: www.heart.org  National Heart, Lung, and Blood Institute: PopSteam.is Summary  High cholesterol increases your risk for heart disease and stroke. By keeping your cholesterol level low, you can reduce your risk for these conditions.  High cholesterol can often be prevented with diet and lifestyle changes.  Work with your health care provider to manage your risk factors, and have your blood tested regularly. This information is not intended to replace advice given to you by your health care provider. Make sure you discuss any questions you have with your health care provider. Document Revised: 01/08/2019 Document Reviewed: 01/08/2019 Elsevier Patient Education  2021 [employer].

## 2020-05-20 ENCOUNTER — Other Ambulatory Visit: Payer: Self-pay | Admitting: *Deleted

## 2020-05-20 MED ORDER — ATORVASTATIN CALCIUM 20 MG PO TABS: 20.0000 mg | ORAL_TABLET | Freq: Every day | ORAL | 3 refills | Status: DC

## 2020-06-06 ENCOUNTER — Encounter: Payer: Self-pay | Admitting: Family Medicine

## 2020-06-15 DIAGNOSIS — Z20822 Contact with and (suspected) exposure to covid-19: Secondary | ICD-10-CM | POA: Diagnosis not present

## 2020-06-15 DIAGNOSIS — Z03818 Encounter for observation for suspected exposure to other biological agents ruled out: Secondary | ICD-10-CM | POA: Diagnosis not present

## 2020-06-16 ENCOUNTER — Telehealth: Payer: Self-pay

## 2020-06-16 NOTE — Telephone Encounter (Signed)
Spoke with pt and informed him to contact [facility]

## 2020-06-16 NOTE — Telephone Encounter (Signed)
LM for pt to return office call regarding rx for Atorvastatin. [facility]ceived [facility] from [facility] requesting 90 day supply. Last refill completed on 05/20/20 for #90 with 3 refills to [facility]. Need to clarify with patient if he would have until mail order received.

## 2020-11-28 DIAGNOSIS — L255 Unspecified contact dermatitis due to plants, except food: Secondary | ICD-10-CM | POA: Diagnosis not present

## 2021-02-03 ENCOUNTER — Encounter: Payer: Self-pay | Admitting: Family Medicine

## 2021-02-03 ENCOUNTER — Ambulatory Visit (INDEPENDENT_AMBULATORY_CARE_PROVIDER_SITE_OTHER): Payer: BC Managed Care – PPO | Admitting: Family Medicine

## 2021-02-03 ENCOUNTER — Other Ambulatory Visit: Payer: Self-pay

## 2021-02-03 VITALS — BP 120/70 | HR 62 | Temp 97.3°F | Ht 70.5 in | Wt 163.0 lb

## 2021-02-03 DIAGNOSIS — E782 Mixed hyperlipidemia: Secondary | ICD-10-CM

## 2021-02-03 DIAGNOSIS — Z79899 Other long term (current) drug therapy: Secondary | ICD-10-CM | POA: Diagnosis not present

## 2021-02-03 DIAGNOSIS — Z0000 Encounter for general adult medical examination without abnormal findings: Secondary | ICD-10-CM

## 2021-02-03 DIAGNOSIS — Z125 Encounter for screening for malignant neoplasm of prostate: Secondary | ICD-10-CM | POA: Diagnosis not present

## 2021-02-03 DIAGNOSIS — Z2821 Immunization not carried out because of patient refusal: Secondary | ICD-10-CM | POA: Diagnosis not present

## 2021-02-03 DIAGNOSIS — Z23 Encounter for immunization: Secondary | ICD-10-CM | POA: Diagnosis not present

## 2021-02-03 DIAGNOSIS — Z131 Encounter for screening for diabetes mellitus: Secondary | ICD-10-CM | POA: Diagnosis not present

## 2021-02-03 DIAGNOSIS — L723 Sebaceous cyst: Secondary | ICD-10-CM

## 2021-02-03 HISTORY — DX: Sebaceous cyst: L72.3

## 2021-02-03 LAB — CBC
HCT: 43.7 % (ref 39.0–52.0)
Hemoglobin: 14.2 g/dL (ref 13.0–17.0)
MCHC: 32.5 g/dL (ref 30.0–36.0)
MCV: 97.8 fl (ref 78.0–100.0)
Platelets: 224 10*3/uL (ref 150.0–400.0)
RBC: 4.47 Mil/uL (ref 4.22–5.81)
RDW: 12.4 % (ref 11.5–15.5)
WBC: 4.7 10*3/uL (ref 4.0–10.5)

## 2021-02-03 LAB — COMPREHENSIVE METABOLIC PANEL
ALT: 21 U/L (ref 0–53)
AST: 21 U/L (ref 0–37)
Albumin: 4.6 g/dL (ref 3.5–5.2)
Alkaline Phosphatase: 44 U/L (ref 39–117)
BUN: 15 mg/dL (ref 6–23)
CO2: 29 mEq/L (ref 19–32)
Calcium: 9.2 mg/dL (ref 8.4–10.5)
Chloride: 103 mEq/L (ref 96–112)
Creatinine, Ser: 0.8 mg/dL (ref 0.40–1.50)
GFR: 106.5 mL/min (ref >60.00)
Glucose, Bld: 84 mg/dL (ref 70–99)
Potassium: 4.2 mEq/L (ref 3.5–5.1)
Sodium: 140 mEq/L (ref 135–145)
Total Bilirubin: 0.7 mg/dL (ref 0.2–1.2)
Total Protein: 7.1 g/dL (ref 6.0–8.3)

## 2021-02-03 LAB — LIPID PANEL
Cholesterol: 138 mg/dL (ref 0–200)
HDL: 48.4 mg/dL (ref >39.00)
LDL Cholesterol: 81 mg/dL (ref 0–99)
NonHDL: 89.48
Total CHOL/HDL Ratio: 3
Triglycerides: 44 mg/dL (ref 0.0–149.0)
VLDL: 8.8 mg/dL (ref 0.0–40.0)

## 2021-02-03 LAB — PSA: PSA: 0.57 ng/mL (ref 0.10–4.00)

## 2021-02-03 LAB — TSH: TSH: 1 u[IU]/mL (ref 0.35–5.50)

## 2021-02-03 LAB — HEMOGLOBIN A1C: Hgb A1c MFr Bld: 5.4 % (ref 4.6–6.5)

## 2021-02-03 MED ORDER — ATORVASTATIN CALCIUM 20 MG PO TABS
20.0000 mg | ORAL_TABLET | Freq: Every day | ORAL | 3 refills | Status: DC
Start: 2021-02-03 — End: 2022-02-03

## 2021-02-03 NOTE — Patient Instructions (Signed)
Great to see you today.  I have refilled the medication(s) we provide.   If labs were collected, we will inform you of lab results once received either by echart message or telephone call.   - echart message- for normal results that have been seen by the patient already.   - telephone call: abnormal results or if patient has not viewed results in their echart.  Health Maintenance, Male Adopting a healthy lifestyle and getting preventive care are important in promoting health and wellness. Ask your health care provider about: The right schedule for you to have regular tests and exams. Things you can do on your own to prevent diseases and keep yourself healthy. What should I know about diet, weight, and exercise? Eat a healthy diet  Eat a diet that includes plenty of vegetables, fruits, low-fat dairy products, and lean protein. Do not eat a lot of foods that are high in solid fats, added sugars, or sodium. Maintain a healthy weight Body mass index (BMI) is a measurement that can be used to identify possible weight problems. It estimates body fat based on height and weight. Your health care provider can help determine your BMI and help you achieve or maintain a healthy weight. Get regular exercise Get regular exercise. This is one of the most important things you can do for your health. Most adults should: Exercise for at least 150 minutes each week. The exercise should increase your heart rate and make you sweat (moderate-intensity exercise). Do strengthening exercises at least twice a week. This is in addition to the moderate-intensity exercise. Spend less time sitting. Even light physical activity can be beneficial. Watch cholesterol and blood lipids Have your blood tested for lipids and cholesterol at 46 years of age, then have this test every 5 years. You may need to have your cholesterol levels checked more often if: Your lipid or cholesterol levels are high. You are older than 46  years of age. You are at high risk for heart disease. What should I know about cancer screening? Many types of cancers can be detected early and may often be prevented. Depending on your health history and family history, you may need to have cancer screening at various ages. This may include screening for: Colorectal cancer. Prostate cancer. Skin cancer. Lung cancer. What should I know about heart disease, diabetes, and high blood pressure? Blood pressure and heart disease High blood pressure causes heart disease and increases the risk of stroke. This is more likely to develop in people who have high blood pressure readings, are of African descent, or are overweight. Talk with your health care provider about your target blood pressure readings. Have your blood pressure checked: Every 3-5 years if you are 18-39 years of age. Every year if you are 40 years old or older. If you are between the ages of 65 and 75 and are a current or former smoker, ask your health care provider if you should have a one-time screening for abdominal aortic aneurysm (AAA). Diabetes Have regular diabetes screenings. This checks your fasting blood sugar level. Have the screening done: Once every three years after age 45 if you are at a normal weight and have a low risk for diabetes. More often and at a younger age if you are overweight or have a high risk for diabetes. What should I know about preventing infection? Hepatitis B If you have a higher risk for hepatitis B, you should be screened for this virus. Talk with your health care   provider to find out if you are at risk for hepatitis B infection. Hepatitis C Blood testing is recommended for: Everyone born from 1945 through 1965. Anyone with known risk factors for hepatitis C. Sexually transmitted infections (STIs) You should be screened each year for STIs, including gonorrhea and chlamydia, if: You are sexually active and are younger than 46 years of age. You  are older than 46 years of age and your health care provider tells you that you are at risk for this type of infection. Your sexual activity has changed since you were last screened, and you are at increased risk for chlamydia or gonorrhea. Ask your health care provider if you are at risk. Ask your health care provider about whether you are at high risk for HIV. Your health care provider may recommend a prescription medicine to help prevent HIV infection. If you choose to take medicine to prevent HIV, you should first get tested for HIV. You should then be tested every 3 months for as long as you are taking the medicine. Follow these instructions at home: Lifestyle Do not use any products that contain nicotine or tobacco, such as cigarettes, e-cigarettes, and chewing tobacco. If you need help quitting, ask your health care provider. Do not use street drugs. Do not share needles. Ask your health care provider for help if you need support or information about quitting drugs. Alcohol use Do not drink alcohol if your health care provider tells you not to drink. If you drink alcohol: Limit how much you have to 0-2 drinks a day. Be aware of how much alcohol is in your drink. In the [place] one drink equals one 12 oz bottle of beer (355 mL), one 5 oz glass of wine (148 mL), or one 1 oz glass of hard liquor (44 mL). General instructions Schedule regular health, dental, and eye exams. Stay current with your vaccines. Tell your health care provider if: You often feel depressed. You have ever been abused or do not feel safe at home. Summary Adopting a healthy lifestyle and getting preventive care are important in promoting health and wellness. Follow your health care provider's instructions about healthy diet, exercising, and getting tested or screened for diseases. Follow your health care provider's instructions on monitoring your cholesterol and blood pressure. This information is not intended to  replace advice given to you by your health care provider. Make sure you discuss any questions you have with your health care provider. Document Revised: 06/05/2020 Document Reviewed: 03/21/2018 Elsevier Patient Education  2022 Elsevier Inc.  

## 2021-02-03 NOTE — Progress Notes (Signed)
This visit occurred during the SARS-CoV-2 public health emergency.  Safety protocols were in place, including screening questions prior to the visit, additional usage of staff PPE, and extensive cleaning of exam room while observing appropriate contact time as indicated for disinfecting solutions.    Patient ID: Craig Burton, male  DOB: 17-Jan-1976, 44 y.o.   MRN: [id number] Patient Care Team    Relationship Specialty Notifications Start End  Natalia Leatherwood, DO PCP - General Family Medicine  12/29/15     Chief Complaint  Patient presents with   Annual Exam    Pt is fasting    Subjective: Craig Burton is a 45 y.o. male present for CPE. All past medical history, surgical history, allergies, family history, immunizations, medications and social history were updated in the electronic medical record today. All recent labs, ED visits and hospitalizations within the last year were reviewed.  Health maintenance:  Colonoscopy: No fhx 04/2020> 10 yrs Immunizations: tdap 2013, Influenza declined(encouraged yearly), covid series completed.  Infectious disease screening: HIV declined PSA:  Father fhx (80s). No sx. unintentional weight loss  Lab Results  Component Value Date   PSA 0.79 02/04/2020   PSA 0.54 01/29/2019  , pt was counseled on prostate cancer screenings.  Assistive device: none Oxygen BJS:EGBT Patient has a Dental home. Hospitalizations/ED visits: reviewed  Depression screen [facility] 2/9 02/03/2021 02/04/2020 01/29/2019 01/12/2018 11/29/2017  Decreased Interest 0 0 0 0 0  Down, Depressed, Hopeless 0 0 0 0 0  PHQ - 2 Score 0 0 0 0 0   No flowsheet data found.     Fall Risk  11/29/2017 12/29/2015  Falls in the past year? No No   Immunization History  Administered Date(s) Administered   PFIZER(Purple Top)SARS-COV-2 Vaccination 06/19/2019, 07/15/2019, 03/14/2020   Tdap 04/12/2011, 02/03/2021    Past Medical History:  Diagnosis Date   Allergy    Chicken pox    Hordeolum  externum of right eye    Hyperlipidemia    Raynaud's syndrome 2018   fingers, very quick recovery, occurs in winter.   Allergies  Allergen Reactions   Penicillins Hives   Past Surgical History:  Procedure Laterality Date   VASECTOMY  2011   WISDOM TOOTH EXTRACTION     Family History  Problem Relation Age of Onset   Arthritis Mother    Diabetes Mother    Prostate cancer Father 62       died at 64 from prostate cancer   Heart disease Father        MI- 79   Glaucoma Father    Arthritis Maternal Grandmother    Diabetes Maternal Grandmother    Stroke Maternal Grandmother        30   Glaucoma Maternal Grandmother    Diabetes Maternal Grandfather    Colon cancer Neg Hx    Esophageal cancer Neg Hx    Rectal cancer Neg Hx    Stomach cancer Neg Hx    Social History   Social History Narrative   Married to [place]). 2 Children (Will and [name]).    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    Feels safe in his relationships.     Allergies as of 02/03/2021       Reactions   Penicillins Hives        Medication List        Accurate as of February 03, 2021  9:20 AM. If you have any questions, ask your nurse or doctor.          STOP taking these medications    Fish Oil 1000 MG Caps Stopped by: Felix Pacini, DO       TAKE these medications    atorvastatin 20 MG tablet Commonly known as: LIPITOR Take 1 tablet (20 mg total) by mouth daily.       All past medical history, surgical history, allergies, family history, immunizations andmedications were updated in the EMR today and reviewed under the history and medication portions of their EMR.     No results found for this or any previous visit (from the past 2160 hour(s)).   ROS: 14 pt review of systems performed and negative (unless mentioned in an HPI)  Objective: BP 120/70   Pulse 62   Temp (!) 97.3 F (36.3 C)  (Oral)   Ht 5' 10.5" (1.791 m)   Wt 163 lb (73.9 kg)   SpO2 100%   BMI 23.06 kg/m  Gen: Afebrile. No acute distress. Nontoxic in appearance, well-developed, well-nourished,  very pleasant male.  HENT: AT. [place]. ~1.5 cm round, mobile cyst posterior scalp. Bilateral TM visualized and normal in appearance, normal external auditory canal. MMM, no oral lesions, adequate dentition. Bilateral nares within normal limits. Throat without erythema, ulcerations or exudates. no Cough on exam, no hoarseness on exam. Eyes:Pupils Equal Round Reactive to light, Extraocular movements intact,  Conjunctiva without redness, discharge or icterus. Neck/lymp/endocrine: Supple,no lymphadenopathy, no thyromegaly CV: RRR no murmur, no edema, +2/4 P posterior tibialis pulses.  Chest: CTAB, no wheeze, rhonchi or crackles. normal Respiratory effort. good Air movement. Abd: Soft. flat. NTND. BS present. no Masses palpated. No hepatosplenomegaly. No rebound tenderness or guarding. Skin: no rashes, purpura or petechiae. Warm and well-perfused. Skin intact. Neuro/Msk: Normal gait. PERLA. EOMi. Alert. Oriented x3.  Cranial nerves II through XII intact. Muscle strength 5/5 upper/lower extremity. DTRs equal bilaterally. Psych: Normal affect, dress and demeanor. Normal speech. Normal thought content and judgment.  No results found.  Assessment/plan:Craig Burton is a 45 y.o. male present for CPE influenza- Vaccination not carried out because of patient refusal Sebaceous cyst: Causes discomfort posterior scalp.  Referral to gen surg On statin therapy/Mixed hyperlipidemia Stable.  Continue lipitor  Continue exercise and heart healthy diet.  - CBC - Comprehensive metabolic panel - Lipid panel - TSH Tdap administered today.  Diabetes mellitus screening - Hemoglobin A1c Prostate cancer screening/fhx present - PSA Routine general medical examination at a health care facility Colonoscopy: No fhx 04/2020> 10  yrs Immunizations: tdap 2013, Influenza declined(encouraged yearly), covid series completed.  Infectious disease screening: HIV declined PSA:  Father fhx (80s). No sx. unintentional weight loss  Patient was encouraged to exercise greater than 150 minutes a week. Patient was encouraged to choose a diet filled with fresh fruits and vegetables, and lean meats. AVS provided to patient today for education/recommendation on gender specific health and safety maintenance.  Return in about 1 year (around 02/04/2022) for CPE (30 min).   Orders Placed This Encounter  Procedures   Tdap vaccine greater than or equal to 7yo IM   CBC   Comprehensive metabolic panel   Hemoglobin A1c   Lipid panel   TSH   PSA   Ambulatory referral to General Surgery   Meds ordered this encounter  Medications   atorvastatin (LIPITOR) 20 MG tablet    Sig: Take 1 tablet (20 mg total) by mouth daily.  Dispense:  90 tablet    Refill:  3   Referral Orders         Ambulatory referral to General Surgery       Note is dictated utilizing voice recognition software. Although note has been proof read prior to signing, occasional typographical errors still can be missed. If any questions arise, please do not hesitate to call for verification.  Electronically signed by: Felix Pacini, DO [facility] [place]

## 2021-02-12 ENCOUNTER — Encounter: Payer: Self-pay | Admitting: Family Medicine

## 2021-03-08 DIAGNOSIS — L723 Sebaceous cyst: Secondary | ICD-10-CM | POA: Diagnosis not present

## 2021-04-30 DIAGNOSIS — L723 Sebaceous cyst: Secondary | ICD-10-CM | POA: Diagnosis not present

## 2021-04-30 DIAGNOSIS — L7211 Pilar cyst: Secondary | ICD-10-CM | POA: Diagnosis not present

## 2021-05-16 ENCOUNTER — Encounter: Payer: Self-pay | Admitting: Family Medicine

## 2021-05-17 ENCOUNTER — Telehealth (INDEPENDENT_AMBULATORY_CARE_PROVIDER_SITE_OTHER): Payer: BC Managed Care – PPO | Admitting: Family Medicine

## 2021-05-17 ENCOUNTER — Encounter: Payer: Self-pay | Admitting: Family Medicine

## 2021-05-17 ENCOUNTER — Other Ambulatory Visit: Payer: Self-pay

## 2021-05-17 DIAGNOSIS — U071 COVID-19: Secondary | ICD-10-CM

## 2021-05-17 DIAGNOSIS — R051 Acute cough: Secondary | ICD-10-CM

## 2021-05-17 MED ORDER — NIRMATRELVIR/RITONAVIR (PAXLOVID)TABLET: 3.0000 | ORAL_TABLET | Freq: Two times a day (BID) | ORAL | 0 refills | Status: AC

## 2021-05-17 NOTE — Telephone Encounter (Signed)
Pt scheduled this afternoon.

## 2021-05-17 NOTE — Patient Instructions (Signed)
10 Things You Can Do to Manage Your COVID-19 Symptoms at Home ?If you have possible or confirmed COVID-19 ?Stay home except to get medical care. ?Monitor your symptoms carefully. If your symptoms get worse, call your healthcare provider immediately. ?Get rest and stay hydrated. ?If you have a medical appointment, call the healthcare provider ahead of time and tell them that you have or may have COVID-19. ?For medical emergencies, call 911 and notify the dispatch personnel that you have or may have COVID-19. ?Cover your cough and sneezes with a tissue or use the inside of your elbow. ?Wash your hands often with soap and water for at least 20 seconds or clean your hands with an alcohol-based hand sanitizer that contains at least 60% alcohol. ?As much as possible, stay in a specific room and away from other people in your home. Also, you should use a separate bathroom, if available. If you need to be around other people in or outside of the home, wear a mask. ?Avoid sharing personal items with other people in your household, like dishes, towels, and bedding. ?Clean all surfaces that are touched often, like counters, tabletops, and doorknobs. Use household cleaning sprays or wipes according to the label instructions. ?cdc.gov/coronavirus ?10/25/2019 ?This information is not intended to replace advice given to you by your health care provider. Make sure you discuss any questions you have with your health care provider. ?Document Revised: 12/18/2020 Document Reviewed: 12/18/2020 ?Elsevier Patient Education ? 2022 Elsevier Inc. ? ?

## 2021-05-17 NOTE — Telephone Encounter (Signed)
No action needed

## 2021-05-17 NOTE — Telephone Encounter (Signed)
Please advise 

## 2021-05-17 NOTE — Progress Notes (Signed)
VIRTUAL VISIT VIA VIDEO  I connected with Craig Burton on 05/17/21 at  2:45 PM EST by elemedicine application and verified that I am speaking with the correct person using two identifiers. Location patient: Home Location provider: [facility], Office Persons participating in the virtual visit: Patient, Dr. Claiborne Billings and Reece Agar. Cesar, CMA  I discussed the limitations of evaluation and management by telemedicine and the availability of in person appointments. The patient expressed understanding and agreed to proceed.   SUBJECTIVE Chief Complaint  Patient presents with   Covid Positive    Tested positive Saturday,  symptoms started Thursday, Sore throat, chest congestion, fatigue, runny nose    HPI: Craig Burton is a 46 y.o. male present for covid + illness started 4 days ago. He has been traveling/flying a few times over the last week.  Started mucinex DM. Tylenol. Cough drops.  Associated symptoms: Sore throat, fatigue, hdx. Fever, chills. Mild cough. Denies nausea, vomit, shortness of breath.   ROS: See pertinent positives and negatives per HPI.  Patient Active Problem List   Diagnosis Date Noted   Sebaceous cyst 02/03/2021   On statin therapy 05/08/2020   Mixed hyperlipidemia 05/08/2020   Unintentional weight loss 01/29/2019   Raynaud's Syndrome 01/12/2018   Vaccination not carried out because of patient refusal 01/06/2017   Encounter for preventive health examination 12/29/2015    Social History   Tobacco Use   Smoking status: Never   Smokeless tobacco: Never  Substance Use Topics   Alcohol use: Yes    Alcohol/week: 7.0 standard drinks    Types: 7 Cans of beer per week    Comment: daily 1-2 drinks     Current Outpatient Medications:    atorvastatin (LIPITOR) 20 MG tablet, Take 1 tablet (20 mg total) by mouth daily., Disp: 90 tablet, Rfl: 3   nirmatrelvir/ritonavir EUA (PAXLOVID) 20 x 150 MG & 10 x 100MG  TABS, Take 3 tablets by mouth 2 (two) times daily for 5 days.  (Take nirmatrelvir 150 mg two tablets twice daily for 5 days and ritonavir 100 mg one tablet twice daily for 5 days) Patient GFR is > 90, Disp: 30 tablet, Rfl: 0  Allergies  Allergen Reactions   Penicillins Hives    OBJECTIVE: There were no vitals taken for this visit. Gen: No acute distress. Nontoxic in appearance.  HENT: AT. [place].   Eyes:Pupils Equal Round Reactive to light, Extraocular movements intact,  Conjunctiva without redness, discharge or icterus. Chest: Cough or shortness of breath not present Skin: no rashes, purpura or petechiae.  Neuro: . Alert. Oriented x3  Psych: Normal affect and demeanor. Normal speech. Normal thought content and judgment.  ASSESSMENT AND PLAN: Craig Burton is a 46 y.o. male present for  COVID-19/Acute cough Rest, hydrate.  Continue tylenol, otc tx- mucinex (DM if cough), nettie pot or nasal saline.  paxlovid prescribed, take until completed.  - hold statin for 1 week.  F/U 2 weeks if not improved. - sooner if worsening.  Reviewed home care instructions for COVID. Advised self-isolation at home for at least 5 days. After 5 days, if improved and fever resolved, can be in public, but should wear a mask around others for an additional 5 days. If symptoms, esp, dyspnea develops/worsens, recommend in-person evaluation at either an urgent care or the emergency room.    48, DO 05/17/2021   No follow-ups on file.  No orders of the defined types were placed in this encounter.  Meds ordered this encounter  Medications   nirmatrelvir/ritonavir EUA (PAXLOVID) 20 x 150 MG & 10 x 100MG  TABS    Sig: Take 3 tablets by mouth 2 (two) times daily for 5 days. (Take nirmatrelvir 150 mg two tablets twice daily for 5 days and ritonavir 100 mg one tablet twice daily for 5 days) Patient GFR is > 90    Dispense:  30 tablet    Refill:  0   Referral Orders  No referral(s) requested today

## 2021-05-17 NOTE — Telephone Encounter (Signed)
Attempted to contact pt to schedule vv. LVM to return call.

## 2021-08-25 DIAGNOSIS — L578 Other skin changes due to chronic exposure to nonionizing radiation: Secondary | ICD-10-CM | POA: Diagnosis not present

## 2021-08-25 DIAGNOSIS — D1801 Hemangioma of skin and subcutaneous tissue: Secondary | ICD-10-CM | POA: Diagnosis not present

## 2021-08-25 DIAGNOSIS — L821 Other seborrheic keratosis: Secondary | ICD-10-CM | POA: Diagnosis not present

## 2021-08-25 DIAGNOSIS — L814 Other melanin hyperpigmentation: Secondary | ICD-10-CM | POA: Diagnosis not present

## 2022-02-03 ENCOUNTER — Encounter: Payer: Self-pay | Admitting: Family Medicine

## 2022-02-03 ENCOUNTER — Ambulatory Visit (INDEPENDENT_AMBULATORY_CARE_PROVIDER_SITE_OTHER): Payer: BC Managed Care – PPO | Admitting: Family Medicine

## 2022-02-03 VITALS — BP 135/88 | HR 69 | Temp 98.0°F | Ht 71.46 in | Wt 166.2 lb

## 2022-02-03 DIAGNOSIS — Z79899 Other long term (current) drug therapy: Secondary | ICD-10-CM | POA: Diagnosis not present

## 2022-02-03 DIAGNOSIS — E782 Mixed hyperlipidemia: Secondary | ICD-10-CM | POA: Diagnosis not present

## 2022-02-03 DIAGNOSIS — Z8042 Family history of malignant neoplasm of prostate: Secondary | ICD-10-CM

## 2022-02-03 DIAGNOSIS — Z125 Encounter for screening for malignant neoplasm of prostate: Secondary | ICD-10-CM

## 2022-02-03 DIAGNOSIS — Z8249 Family history of ischemic heart disease and other diseases of the circulatory system: Secondary | ICD-10-CM

## 2022-02-03 DIAGNOSIS — Z0000 Encounter for general adult medical examination without abnormal findings: Secondary | ICD-10-CM | POA: Diagnosis not present

## 2022-02-03 LAB — CBC WITH DIFFERENTIAL/PLATELET
Basophils Absolute: 0.1 10*3/uL (ref 0.0–0.1)
Basophils Relative: 1.1 % (ref 0.0–3.0)
Eosinophils Absolute: 0.1 10*3/uL (ref 0.0–0.7)
Eosinophils Relative: 1.4 % (ref 0.0–5.0)
HCT: 43.7 % (ref 39.0–52.0)
Hemoglobin: 14.4 g/dL (ref 13.0–17.0)
Lymphocytes Relative: 31.4 % (ref 12.0–46.0)
Lymphs Abs: 1.7 10*3/uL (ref 0.7–4.0)
MCHC: 33 g/dL (ref 30.0–36.0)
MCV: 97.6 fl (ref 78.0–100.0)
Monocytes Absolute: 0.4 10*3/uL (ref 0.1–1.0)
Monocytes Relative: 7.9 % (ref 3.0–12.0)
Neutro Abs: 3.1 10*3/uL (ref 1.4–7.7)
Neutrophils Relative %: 58.2 % (ref 43.0–77.0)
Platelets: 236 10*3/uL (ref 150.0–400.0)
RBC: 4.48 Mil/uL (ref 4.22–5.81)
RDW: 12.6 % (ref 11.5–15.5)
WBC: 5.4 10*3/uL (ref 4.0–10.5)

## 2022-02-03 LAB — COMPREHENSIVE METABOLIC PANEL
ALT: 18 U/L (ref 0–53)
AST: 20 U/L (ref 0–37)
Albumin: 4.7 g/dL (ref 3.5–5.2)
Alkaline Phosphatase: 44 U/L (ref 39–117)
BUN: 18 mg/dL (ref 6–23)
CO2: 31 mEq/L (ref 19–32)
Calcium: 9.6 mg/dL (ref 8.4–10.5)
Chloride: 101 mEq/L (ref 96–112)
Creatinine, Ser: 0.85 mg/dL (ref 0.40–1.50)
GFR: 103.84 mL/min (ref >60.00)
Glucose, Bld: 82 mg/dL (ref 70–99)
Potassium: 4.6 mEq/L (ref 3.5–5.1)
Sodium: 139 mEq/L (ref 135–145)
Total Bilirubin: 0.8 mg/dL (ref 0.2–1.2)
Total Protein: 7.4 g/dL (ref 6.0–8.3)

## 2022-02-03 LAB — LIPID PANEL
Cholesterol: 149 mg/dL (ref 0–200)
HDL: 52.4 mg/dL (ref >39.00)
LDL Cholesterol: 86 mg/dL (ref 0–99)
NonHDL: 96.12
Total CHOL/HDL Ratio: 3
Triglycerides: 53 mg/dL (ref 0.0–149.0)
VLDL: 10.6 mg/dL (ref 0.0–40.0)

## 2022-02-03 LAB — PSA: PSA: 0.66 ng/mL (ref 0.10–4.00)

## 2022-02-03 LAB — HEMOGLOBIN A1C: Hgb A1c MFr Bld: 5.4 % (ref 4.6–6.5)

## 2022-02-03 LAB — TSH: TSH: 1.46 u[IU]/mL (ref 0.35–5.50)

## 2022-02-03 MED ORDER — ATORVASTATIN CALCIUM 20 MG PO TABS: 20.0000 mg | ORAL_TABLET | Freq: Every day | ORAL | 3 refills | Status: DC

## 2022-02-03 NOTE — Progress Notes (Signed)
Patient ID: Craig Burton, male  DOB: 01/20/1976, 46 y.o.   MRN: [id number] Patient Care Team    Relationship Specialty Notifications Start End  Natalia Leatherwood, DO PCP - General Family Medicine  12/29/15   Armbruster, Willaim Rayas, MD Consulting Physician Gastroenterology  02/03/22     Chief Complaint  Patient presents with   Annual Exam    Pt is fasting    Subjective: Craig Burton is a 46 y.o. male present for CPE. All past medical history, surgical history, allergies, family history, immunizations, medications and social history were updated in the electronic medical record today. All recent labs, ED visits and hospitalizations within the last year were reviewed.  Health maintenance:  Colonoscopy: No fhx .04/2020> 10 yrs (Dr. Adela Lank) Immunizations: tdap UTD 2022, Influenza declined(encouraged yearly), covid series completed.  Infectious disease screening: HIV declined PSA:  Father fhx (80s). No sx.  Lab Results  Component Value Date   PSA 0.57 02/03/2021   PSA 0.79 02/04/2020   PSA 0.54 01/29/2019  , pt was counseled on prostate cancer screenings.       02/03/2022    8:27 AM 02/03/2021    8:29 AM 02/04/2020   11:15 AM 01/29/2019   11:33 AM 01/12/2018    2:27 PM  Depression screen PHQ 2/9  Decreased Interest 0 0 0 0 0  Down, Depressed, Hopeless 0 0 0 0 0  PHQ - 2 Score 0 0 0 0 0  Altered sleeping 0      Tired, decreased energy 0      Change in appetite 0      Feeling bad or failure about yourself  0      Trouble concentrating 0      Moving slowly or fidgety/restless 0      Suicidal thoughts 0      PHQ-9 Score 0          02/03/2022    8:32 AM  GAD 7 : Generalized Anxiety Score  Nervous, Anxious, on Edge 0  Control/stop worrying 0  Worry too much - different things 0  Trouble relaxing 0  Restless 0  Easily annoyed or irritable 0  Afraid - awful might happen 0  Total GAD 7 Score 0            02/03/2022    8:27 AM 11/29/2017    5:06 PM 12/29/2015     9:21 AM  Fall Risk   Falls in the past year? 0 No No  Number falls in past yr: 0    Injury with Fall? 0    Follow up Falls evaluation completed        Immunization History  Administered Date(s) Administered   PFIZER(Purple Top)SARS-COV-2 Vaccination 06/19/2019, 07/15/2019, 03/14/2020   Pfizer Covid-19 Vaccine Bivalent Booster 74yrs & up 04/22/2021   Tdap 04/12/2011, 02/03/2021     Past Medical History:  Diagnosis Date   Allergy    Chicken pox    Hordeolum externum of right eye    Hyperlipidemia    Raynaud's syndrome 2018   fingers, very quick recovery, occurs in winter.   Sebaceous cyst 02/03/2021   Allergies  Allergen Reactions   Penicillins Hives   Past Surgical History:  Procedure Laterality Date   VASECTOMY  2011   WISDOM TOOTH EXTRACTION     Family History  Problem Relation Age of Onset   Arthritis Mother    Diabetes Mother    Heart disease Mother  CABG   Prostate cancer Father 3       died at 20 from prostate cancer   Heart disease Father        MI- 91   Glaucoma Father    Arthritis Maternal Grandmother    Diabetes Maternal Grandmother    Stroke Maternal Grandmother        90   Glaucoma Maternal Grandmother    Diabetes Maternal Grandfather    Colon cancer Neg Hx    Esophageal cancer Neg Hx    Rectal cancer Neg Hx    Stomach cancer Neg Hx    Social History   Social History Narrative   Married to [place]). 2 Children (Will and [name]).    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    Feels safe in his relationships.     Allergies as of 02/03/2022       Reactions   Penicillins Hives        Medication List        Accurate as of February 03, 2022  9:02 AM. If you have any questions, ask your nurse or doctor.          atorvastatin 20 MG tablet Commonly known as: LIPITOR Take 1 tablet (20 mg total) by mouth daily.       All past  medical history, surgical history, allergies, family history, immunizations andmedications were updated in the EMR today and reviewed under the history and medication portions of their EMR.     No results found for this or any previous visit (from the past 2160 hour(s)).   ROS 14 pt review of systems performed and negative (unless mentioned in an HPI)  Objective: BP 135/88   Pulse 69   Temp 98 F (36.7 C)   Ht 5' 11.46" (1.815 m)   Wt 166 lb 3.2 oz (75.4 kg)   SpO2 100%   BMI 22.88 kg/m  Physical Exam Constitutional:      General: He is not in acute distress.    Appearance: Normal appearance. He is not ill-appearing, toxic-appearing or diaphoretic.  HENT:     Head: Normocephalic and atraumatic.     Right Ear: Tympanic membrane, ear canal and external ear normal. There is no impacted cerumen.     Left Ear: Tympanic membrane, ear canal and external ear normal. There is no impacted cerumen.     Nose: Nose normal. No congestion or rhinorrhea.     Mouth/Throat:     Mouth: Mucous membranes are moist.     Pharynx: Oropharynx is clear. No oropharyngeal exudate or posterior oropharyngeal erythema.  Eyes:     General: No scleral icterus.       Right eye: No discharge.        Left eye: No discharge.     Extraocular Movements: Extraocular movements intact.     Pupils: Pupils are equal, round, and reactive to light.  Cardiovascular:     Rate and Rhythm: Normal rate and regular rhythm.     Pulses: Normal pulses.     Heart sounds: Normal heart sounds. No murmur heard.    No friction rub. No gallop.  Pulmonary:     Effort: Pulmonary effort is normal. No respiratory distress.     Breath sounds: Normal breath sounds. No stridor. No wheezing, rhonchi or rales.  Chest:     Chest wall: No tenderness.  Abdominal:  General: Abdomen is flat. Bowel sounds are normal. There is no distension.     Palpations: Abdomen is soft. There is no mass.     Tenderness: There is no abdominal tenderness.  There is no right CVA tenderness, left CVA tenderness, guarding or rebound.     Hernia: No hernia is present.  Musculoskeletal:        General: No swelling or tenderness. Normal range of motion.     Cervical back: Normal range of motion and neck supple.     Right lower leg: No edema.     Left lower leg: No edema.  Lymphadenopathy:     Cervical: No cervical adenopathy.  Skin:    General: Skin is warm and dry.     Coloration: Skin is not jaundiced.     Findings: No bruising, lesion or rash.  Neurological:     General: No focal deficit present.     Mental Status: He is alert and oriented to person, place, and time. Mental status is at baseline.     Cranial Nerves: No cranial nerve deficit.     Sensory: No sensory deficit.     Motor: No weakness.     Coordination: Coordination normal.     Gait: Gait normal.     Deep Tendon Reflexes: Reflexes normal.  Psychiatric:        Mood and Affect: Mood normal.        Behavior: Behavior normal.        Thought Content: Thought content normal.        Judgment: Judgment normal.     No results found.  Assessment/plan: Craig Burton is a 46 y.o. male present for CPE Mixed hyperlipidemia/Encounter for long-term current use of medication/fhx HD Continue statin - CBC with Differential/Platelet - Comprehensive metabolic panel - Lipid panel - TSH - Hemoglobin A1c Discussed benefits of Cardiac CT w/ his FH and HLD. He would like to proceed with self-pay screen.  FH: prostate cancer/Prostate cancer screening - PSA  Encounter for preventive health examination Colonoscopy: No fhx .04/2020> 10 yrs (Dr. Adela Lank) Immunizations: tdap UTD 2022, Influenza declined(encouraged yearly), covid series completed.  Infectious disease screening: HIV declined PSA:  Father fhx (80s). No sx.  Patient was encouraged to exercise greater than 150 minutes a week. Patient was encouraged to choose a diet filled with fresh fruits and vegetables, and lean meats. AVS  provided to patient today for education/recommendation on gender specific health and safety maintenance.  Return in about 1 year (around 02/04/2023) for cpe (20 min).  Orders Placed This Encounter  Procedures   CT CARDIAC SCORING (SELF PAY ONLY)   CBC with Differential/Platelet   Comprehensive metabolic panel   Lipid panel   TSH   Hemoglobin A1c   PSA    Meds ordered this encounter  Medications   atorvastatin (LIPITOR) 20 MG tablet    Sig: Take 1 tablet (20 mg total) by mouth daily.    Dispense:  90 tablet    Refill:  3   Referral Orders  No referral(s) requested today     Note is dictated utilizing voice recognition software. Although note has been proof read prior to signing, occasional typographical errors still can be missed. If any questions arise, please do not hesitate to call for verification.  Electronically signed by: Felix Pacini, DO [facility] [place]

## 2022-02-03 NOTE — Patient Instructions (Addendum)
Return in about 1 year (around 02/04/2023).        Great to see you today.  I have refilled the medication(s) we provide.   If labs were collected, we will inform you of lab results once received either by echart message or telephone call.   - echart message- for normal results that have been seen by the patient already.   - telephone call: abnormal results or if patient has not viewed results in their echart.   Health Maintenance, Male Adopting a healthy lifestyle and getting preventive care are important in promoting health and wellness. Ask your health care provider about: The right schedule for you to have regular tests and exams. Things you can do on your own to prevent diseases and keep yourself healthy. What should I know about diet, weight, and exercise? Eat a healthy diet  Eat a diet that includes plenty of vegetables, fruits, low-fat dairy products, and lean protein. Do not eat a lot of foods that are high in solid fats, added sugars, or sodium. Maintain a healthy weight Body mass index (BMI) is a measurement that can be used to identify possible weight problems. It estimates body fat based on height and weight. Your health care provider can help determine your BMI and help you achieve or maintain a healthy weight. Get regular exercise Get regular exercise. This is one of the most important things you can do for your health. Most adults should: Exercise for at least 150 minutes each week. The exercise should increase your heart rate and make you sweat (moderate-intensity exercise). Do strengthening exercises at least twice a week. This is in addition to the moderate-intensity exercise. Spend less time sitting. Even light physical activity can be beneficial. Watch cholesterol and blood lipids Have your blood tested for lipids and cholesterol at 47 years of age, then have this test every 5 years. You may need to have your cholesterol levels checked more often if: Your lipid  or cholesterol levels are high. You are older than 47 years of age. You are at high risk for heart disease. What should I know about cancer screening? Many types of cancers can be detected early and may often be prevented. Depending on your health history and family history, you may need to have cancer screening at various ages. This may include screening for: Colorectal cancer. Prostate cancer. Skin cancer. Lung cancer. What should I know about heart disease, diabetes, and high blood pressure? Blood pressure and heart disease High blood pressure causes heart disease and increases the risk of stroke. This is more likely to develop in people who have high blood pressure readings or are overweight. Talk with your health care provider about your target blood pressure readings. Have your blood pressure checked: Every 3-5 years if you are 62-78 years of age. Every year if you are 60 years old or older. If you are between the ages of 69 and 34 and are a current or former smoker, ask your health care provider if you should have a one-time screening for abdominal aortic aneurysm (AAA). Diabetes Have regular diabetes screenings. This checks your fasting blood sugar level. Have the screening done: Once every three years after age 11 if you are at a normal weight and have a low risk for diabetes. More often and at a younger age if you are overweight or have a high risk for diabetes. What should I know about preventing infection? Hepatitis B If you have a higher risk for hepatitis B, you  should be screened for this virus. Talk with your health care provider to find out if you are at risk for hepatitis B infection. Hepatitis C Blood testing is recommended for: Everyone born from [birth year] through 1966. Anyone with known risk factors for hepatitis C. Sexually transmitted infections (STIs) You should be screened each year for STIs, including gonorrhea and chlamydia, if: You are sexually active and are  younger than 47 years of age. You are older than 47 years of age and your health care provider tells you that you are at risk for this type of infection. Your sexual activity has changed since you were last screened, and you are at increased risk for chlamydia or gonorrhea. Ask your health care provider if you are at risk. Ask your health care provider about whether you are at high risk for HIV. Your health care provider may recommend a prescription medicine to help prevent HIV infection. If you choose to take medicine to prevent HIV, you should first get tested for HIV. You should then be tested every 3 months for as long as you are taking the medicine. Follow these instructions at home: Alcohol use Do not drink alcohol if your health care provider tells you not to drink. If you drink alcohol: Limit how much you have to 0-2 drinks a day. Know how much alcohol is in your drink. In the [place] one drink equals one 12 oz bottle of beer (355 mL), one 5 oz glass of wine (148 mL), or one 1 oz glass of hard liquor (44 mL). Lifestyle Do not use any products that contain nicotine or tobacco. These products include cigarettes, chewing tobacco, and vaping devices, such as e-cigarettes. If you need help quitting, ask your health care provider. Do not use street drugs. Do not share needles. Ask your health care provider for help if you need support or information about quitting drugs. General instructions Schedule regular health, dental, and eye exams. Stay current with your vaccines. Tell your health care provider if: You often feel depressed. You have ever been abused or do not feel safe at home. Summary Adopting a healthy lifestyle and getting preventive care are important in promoting health and wellness. Follow your health care provider's instructions about healthy diet, exercising, and getting tested or screened for diseases. Follow your health care provider's instructions on monitoring your  cholesterol and blood pressure. This information is not intended to replace advice given to you by your health care provider. Make sure you discuss any questions you have with your health care provider. Document Revised: 08/17/2020 Document Reviewed: 08/17/2020 Elsevier Patient Education  [place].

## 2022-03-25 ENCOUNTER — Ambulatory Visit (HOSPITAL_BASED_OUTPATIENT_CLINIC_OR_DEPARTMENT_OTHER)
Admission: RE | Admit: 2022-03-25 | Discharge: 2022-03-25 | Disposition: A | Discharge status: Home / Self Care | Payer: BC Managed Care – PPO | Source: Ambulatory Visit | Attending: Family Medicine | Admitting: Family Medicine

## 2022-03-25 DIAGNOSIS — E782 Mixed hyperlipidemia: Secondary | ICD-10-CM | POA: Insufficient documentation

## 2022-03-25 DIAGNOSIS — Z79899 Other long term (current) drug therapy: Secondary | ICD-10-CM | POA: Insufficient documentation

## 2022-03-25 DIAGNOSIS — Z8249 Family history of ischemic heart disease and other diseases of the circulatory system: Secondary | ICD-10-CM | POA: Insufficient documentation

## 2022-03-28 ENCOUNTER — Telehealth: Payer: Self-pay | Admitting: Family Medicine

## 2022-03-28 DIAGNOSIS — Z79899 Other long term (current) drug therapy: Secondary | ICD-10-CM

## 2022-03-28 DIAGNOSIS — E782 Mixed hyperlipidemia: Secondary | ICD-10-CM

## 2022-03-28 DIAGNOSIS — R931 Abnormal findings on diagnostic imaging of heart and coronary circulation: Secondary | ICD-10-CM | POA: Insufficient documentation

## 2022-03-28 DIAGNOSIS — Z8249 Family history of ischemic heart disease and other diseases of the circulatory system: Secondary | ICD-10-CM

## 2022-03-28 NOTE — Telephone Encounter (Signed)
Please call patient His total coronary calcium score is 88.5.  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   It is considered 92nd percentile for 46 year old white male.   With his family history this does put him in the more at risk category for his age.   He is on the treatment plan of a statin which is what would be recommended. I placed a referral to cardiology for him, so that they can discuss in more detail and for him to be established with cardiology in the event any further testing is required.

## 2022-03-29 NOTE — Telephone Encounter (Signed)
Spoke with pt regarding labs and instructions.   

## 2022-04-26 ENCOUNTER — Ambulatory Visit: Payer: BC Managed Care – PPO | Attending: Cardiology | Admitting: Cardiology

## 2022-04-26 ENCOUNTER — Encounter: Payer: Self-pay | Admitting: Cardiology

## 2022-04-26 VITALS — BP 110/62 | HR 57 | Ht 71.0 in | Wt 172.1 lb

## 2022-04-26 DIAGNOSIS — Z79899 Other long term (current) drug therapy: Secondary | ICD-10-CM | POA: Diagnosis not present

## 2022-04-26 DIAGNOSIS — R072 Precordial pain: Secondary | ICD-10-CM

## 2022-04-26 DIAGNOSIS — R931 Abnormal findings on diagnostic imaging of heart and coronary circulation: Secondary | ICD-10-CM

## 2022-04-26 DIAGNOSIS — R0789 Other chest pain: Secondary | ICD-10-CM | POA: Insufficient documentation

## 2022-04-26 DIAGNOSIS — E782 Mixed hyperlipidemia: Secondary | ICD-10-CM | POA: Diagnosis not present

## 2022-04-26 HISTORY — DX: Other chest pain: R07.89

## 2022-04-26 MED ORDER — METOPROLOL TARTRATE 50 MG PO TABS: ORAL_TABLET | ORAL | 0 refills | Status: DC

## 2022-04-26 NOTE — Progress Notes (Signed)
Cardiology Consultation:    Date:  04/26/2022   ID:  Craig Burton, DOB 05-27-1975, MRN [id number]  PCP:  Ma Hillock, DO  Cardiologist:  Jenne Campus, MD   Referring MD: Ma Hillock, DO   Chief Complaint  Patient presents with   Coronary Artery Disease    Ref by Dr. Raoul Pitch  Abnormal calcium score  History of Present Illness:    Craig Burton is a 47 y.o. male who is being seen today for the evaluation of abnormal calcium score at the request of Kuneff, Bryson, DO.  Past medical history significant for dyslipidemia, he is on statin, also rainout phenomenon, family history of coronary artery disease but not premature, his father lived up to 76, his mother got coronary bypass grafting first coronary event when she was in her mid 23s, because of concerns about his family history he did have calcium score done, calcium score came abnormal 88.5 which is 92% on for his sex age and race.  Obviously he is concerned about it.  Overall he seems to be doing quite well but he described to have some chest pain does pain happen typically in the stressful situation.  At the same time he can run, he ran twice a week usually 3 miles which takes him about 35 minutes, when he ran by himself a little faster when he runs with his wife a little slower.  He does get short of breath while running but otherwise asymptomatic.  He never smoked, he has been taking cholesterol medication, he is not on any special diet.  He does not have hypertension he does not have diabetes.  Past Medical History:  Diagnosis Date   Allergy    Chicken pox    Hordeolum externum of right eye    Hyperlipidemia    Raynaud's syndrome 2018   fingers, very quick recovery, occurs in winter.   Sebaceous cyst 02/03/2021    Past Surgical History:  Procedure Laterality Date   VASECTOMY  2011   WISDOM TOOTH EXTRACTION      Current Medications: Current Meds  Medication Sig   atorvastatin (LIPITOR) 20 MG tablet Take 1 tablet  (20 mg total) by mouth daily.     Allergies:   Penicillins   Social History   Socioeconomic History   Marital status: Married    Spouse name: Not on file   Number of children: Not on file   Years of education: Not on file   Highest education level: Not on file  Occupational History   Not on file  Tobacco Use   Smoking status: Never   Smokeless tobacco: Never  Vaping Use   Vaping Use: Never used  Substance and Sexual Activity   Alcohol use: Yes    Alcohol/week: 7.0 standard drinks of alcohol    Types: 7 Cans of beer per week    Comment: daily 1-2 drinks    Drug use: No   Sexual activity: Yes    Birth control/protection: None  Other Topics Concern   Not on file  Social History Narrative   Married to [place]). 2 Children (Will and [name]).    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    Feels safe in his relationships.    Social Determinants of Health   Financial Resource Strain: Not on file  Food Insecurity: Not on file  Transportation Needs: Not on file  Physical Activity: Not on file  Stress: Not on file  Social Connections: Not on file     Family History: The patient's family history includes Arthritis in his maternal grandmother and mother; Diabetes in his maternal grandfather, maternal grandmother, and mother; Glaucoma in his father and maternal grandmother; Heart disease in his father and mother; Prostate cancer (age of onset: 62) in his father; Stroke in his maternal grandmother. There is no history of Colon cancer, Esophageal cancer, Rectal cancer, or Stomach cancer. ROS:   Please see the history of present illness.    All 14 point review of systems negative except as described per history of present illness.  EKGs/Labs/Other Studies Reviewed:    The following studies were reviewed today: Calcium score 88.5 with 92 percentile.  EKG:  EKG is sinus bradycardia rate 57,  normal QS complex duration fulgent no ST segment change ordered today.  The ekg ordered today demonstrates   Recent Labs: 02/03/2022: ALT 18; BUN 18; Creatinine, Ser 0.85; Hemoglobin 14.4; Platelets 236.0; Potassium 4.6; Sodium 139; TSH 1.46  Recent Lipid Panel    Component Value Date/Time   CHOL 149 02/03/2022 0831   TRIG 53.0 02/03/2022 0831   HDL 52.40 02/03/2022 0831   CHOLHDL 3 02/03/2022 0831   VLDL 10.6 02/03/2022 0831   LDLCALC 86 02/03/2022 0831    Physical Exam:    VS:  BP 110/62 (BP Location: Left Arm, Patient Position: Sitting)   Pulse (!) 57   Ht 5\' 11"  (1.803 m)   Wt 172 lb 1.9 oz (78.1 kg)   SpO2 99%   BMI 24.01 kg/m     Wt Readings from Last 3 Encounters:  04/26/22 172 lb 1.9 oz (78.1 kg)  02/03/22 166 lb 3.2 oz (75.4 kg)  02/03/21 163 lb (73.9 kg)     GEN:  Well nourished, well developed in no acute distress HEENT: Normal NECK: No JVD; No carotid bruits LYMPHATICS: No lymphadenopathy CARDIAC: RRR, no murmurs, no rubs, no gallops RESPIRATORY:  Clear to auscultation without rales, wheezing or rhonchi  ABDOMEN: Soft, non-tender, non-distended MUSCULOSKELETAL:  No edema; No deformity  SKIN: Warm and dry NEUROLOGIC:  Alert and oriented x 3 PSYCHIATRIC:  Normal affect   ASSESSMENT:    1. Agatston coronary artery calcium score less than 100-92nd percentile   2. Mixed hyperlipidemia   3. On statin therapy   4. Atypical chest pain    PLAN:    In order of problems listed above:  Elevated calcium score.  We had a long discussion about meaning of this finding.  He is 92% low which is pretty high.  However he does have some symptoms which are atypical he does get some tightness in the chest when he gets nervous.  Therefore I think there is some value to evaluate for obstructive disease.  I will schedule him to have coronary CT angio.  In the meantime he will start take 1 baby aspirin every single day. Dyslipidemia he has been taking Lipitor 20 mg daily.  I  did review K PN which show me data from February 03, 2022 with LDL of 86 HDL 52.  Will continue. We did talk about healthy lifestyle.  I did tell him about recommendation but [employer] of cardiology of exercising 5 times a week for 30 minutes moderate intensity exercise and family more than that.  I did also discuss basic of Mediterranean diet with him.  He is eager to comply with those advices.  He is trying to  do everything what he can to avoid problems in the future.  Because of this I think his prognosis will be excellent as long as he will take care of himself.   Medication Adjustments/Labs and Tests Ordered: Current medicines are reviewed at length with the patient today.  Concerns regarding medicines are outlined above.  No orders of the defined types were placed in this encounter.  No orders of the defined types were placed in this encounter.   Signed, Georgeanna Lea, MD, [facility]. 04/26/2022 9:23 AM    [facility]

## 2022-04-26 NOTE — Patient Instructions (Addendum)
Medication Instructions:   TAKE: Metoprolol tartrate 25 mg 2 hours prior to CT Scan  *If you need a refill on your cardiac medications before your next appointment, please call your pharmacy*   Lab Work: None Needed  If you have labs (blood work) drawn today and your tests are completely normal, you will receive your results only by: [place] (if you have MyChart) OR A paper copy in the mail If you have any lab test that is abnormal or we need to change your treatment, we will call you to review the results.   Testing/Procedures:   Your cardiac CT will be scheduled at one of the below locations:   [facility] [address] [place], [place] [address] [phone]   At [facility], please arrive at the [facility] and Children's Entrance (Entrance C2) of [facility] 30 minutes prior to test start time. You can use the FREE valet parking offered at entrance C (encouraged to control the heart rate for the test)  Proceed to the [facility] Radiology Department (first floor) to check-in and test prep.  All radiology patients and guests should use entrance C2 at [facility], accessed from [facility], even though the hospital's physical address listed is [address]      Please follow these instructions carefully (unless otherwise directed):  Hold all erectile dysfunction medications at least 3 days (72 hrs) prior to test.  On the Night Before the Test: Be sure to Drink plenty of water. Do not consume any caffeinated/decaffeinated beverages or chocolate 12 hours prior to your test. Do not take any antihistamines 12 hours prior to your test.   On the Day of the Test: Drink plenty of water until 1 hour prior to the test. Do not eat any food 4 hours prior to the test. You may take your regular medications prior to the test.  Take metoprolol (Lopressor) two hours prior to test.        After the Test: Drink  plenty of water. After receiving IV contrast, you may experience a mild flushed feeling. This is normal. On occasion, you may experience a mild rash up to 24 hours after the test. This is not dangerous. If this occurs, you can take Benadryl 25 mg and increase your fluid intake. If you experience trouble breathing, this can be serious. If it is severe call 911 IMMEDIATELY. If it is mild, please call our office. If you take any of these medications: Glipizide/Metformin, Avandament, Glucavance, please do not take 48 hours after completing test unless otherwise instructed.  We will call to schedule your test 2-4 weeks out understanding that some insurance companies will need an authorization prior to the service being performed.   For non-scheduling related questions, please contact the cardiac imaging nurse navigator should you have any questions/concerns: Marchia Bond, Cardiac Imaging Nurse Navigator Gordy Clement, Cardiac Imaging Nurse Navigator [facility] Direct Office Dial: [phone]   For scheduling needs, including cancellations and rescheduling, please call [place], [phone].    Follow-Up: At [facility], you and your health needs are our priority.  As part of our continuing mission to provide you with exceptional heart care, we have created designated Provider Care Teams.  These Care Teams include your primary Cardiologist (physician) and Advanced Practice Providers (APPs -  Physician Assistants and Nurse Practitioners) who all work together to provide you with the care you need, when you need it.  We  recommend signing up for the patient portal called "MyChart".  Sign up information is provided on this After Visit Summary.  MyChart is used to connect with patients for Virtual Visits (Telemedicine).  Patients are able to view lab/test results, encounter notes, upcoming appointments, etc.  Non-urgent messages can be sent to your provider as well.   To  learn more about what you can do with MyChart, go to [email or web address].    Your next appointment:   3 month(s)  The format for your next appointment:   In Person  Provider:   Jenne Campus, MD   Other Instructions Cardiac CT Angiogram A cardiac CT angiogram is a procedure to look at the heart and the area around the heart. It may be done to help find the cause of chest pains or other symptoms of heart disease. During this procedure, a substance called contrast dye is injected into the blood vessels in the area to be checked. A large X-ray machine, called a CT scanner, then takes detailed pictures of the heart and the surrounding area. The procedure is also sometimes called a coronary CT angiogram, coronary artery scanning, or CTA. A cardiac CT angiogram allows the health care provider to see how well blood is flowing to and from the heart. The health care provider will be able to see if there are any problems, such as: Blockage or narrowing of the coronary arteries in the heart. Fluid around the heart. Signs of weakness or disease in the muscles, valves, and tissues of the heart. Tell a health care provider about: Any allergies you have. This is especially important if you have had a previous allergic reaction to contrast dye. All medicines you are taking, including vitamins, herbs, eye drops, creams, and over-the-counter medicines. Any blood disorders you have. Any surgeries you have had. Any medical conditions you have. Whether you are pregnant or may be pregnant. Any anxiety disorders, chronic pain, or other conditions you have that may increase your stress or prevent you from lying still. What are the risks? Generally, this is a safe procedure. However, problems may occur, including: Bleeding. Infection. Allergic reactions to medicines or dyes. Damage to other structures or organs. Kidney damage from the contrast dye that is used. Increased risk of cancer from radiation  exposure. This risk is low. Talk with your health care provider about: The risks and benefits of testing. How you can receive the lowest dose of radiation. What happens before the procedure? Wear comfortable clothing and remove any jewelry, glasses, dentures, and hearing aids. Follow instructions from your health care provider about eating and drinking. This may include: For 12 hours before the procedure -- avoid caffeine. This includes tea, coffee, soda, energy drinks, and diet pills. Drink plenty of water or other fluids that do not have caffeine in them. Being well hydrated can prevent complications. For 4-6 hours before the procedure -- stop eating and drinking. The contrast dye can cause nausea, but this is less likely if your stomach is empty. Ask your health care provider about changing or stopping your regular medicines. This is especially important if you are taking diabetes medicines, blood thinners, or medicines to treat problems with erections (erectile dysfunction). What happens during the procedure?  Hair on your chest may need to be removed so that small sticky patches called electrodes can be placed on your chest. These will transmit information that helps to monitor your heart during the procedure. An IV will be inserted into one of your veins. You might  be given a medicine to control your heart rate during the procedure. This will help to ensure that good images are obtained. You will be asked to lie on an exam table. This table will slide in and out of the CT machine during the procedure. Contrast dye will be injected into the IV. You might feel warm, or you may get a metallic taste in your mouth. You will be given a medicine called nitroglycerin. This will relax or dilate the arteries in your heart. The table that you are lying on will move into the CT machine tunnel for the scan. The person running the machine will give you instructions while the scans are being done. You may  be asked to: Keep your arms above your head. Hold your breath. Stay very still, even if the table is moving. When the scanning is complete, you will be moved out of the machine. The IV will be removed. The procedure may vary among health care providers and hospitals. What can I expect after the procedure? After your procedure, it is common to have: A metallic taste in your mouth from the contrast dye. A feeling of warmth. A headache from the nitroglycerin. Follow these instructions at home: Take over-the-counter and prescription medicines only as told by your health care provider. If you are told, drink enough fluid to keep your urine pale yellow. This will help to flush the contrast dye out of your body. Most people can return to their normal activities right after the procedure. Ask your health care provider what activities are safe for you. It is up to you to get the results of your procedure. Ask your health care provider, or the department that is doing the procedure, when your results will be ready. Keep all follow-up visits as told by your health care provider. This is important. Contact a health care provider if: You have any symptoms of allergy to the contrast dye. These include: Shortness of breath. Rash or hives. A racing heartbeat. Summary A cardiac CT angiogram is a procedure to look at the heart and the area around the heart. It may be done to help find the cause of chest pains or other symptoms of heart disease. During this procedure, a large X-ray machine, called a CT scanner, takes detailed pictures of the heart and the surrounding area after a contrast dye has been injected into blood vessels in the area. Ask your health care provider about changing or stopping your regular medicines before the procedure. This is especially important if you are taking diabetes medicines, blood thinners, or medicines to treat erectile dysfunction. If you are told, drink enough fluid to  keep your urine pale yellow. This will help to flush the contrast dye out of your body. This information is not intended to replace advice given to you by your health care provider. Make sure you discuss any questions you have with your health care provider. Document Revised: 11/21/2018 Document Reviewed: 11/21/2018 Elsevier Patient Education  2020 [employer].

## 2022-04-29 ENCOUNTER — Telehealth (HOSPITAL_COMMUNITY): Payer: Self-pay | Admitting: *Deleted

## 2022-04-29 NOTE — Telephone Encounter (Signed)
Reaching out to patient to offer assistance regarding upcoming cardiac imaging study; pt verbalizes understanding of appt date/time, parking situation and where to check in, pre-test NPO status and verified current allergies; name and call back number provided for further questions should they arise  Craig Clement RN Navigator Cardiac Imaging [facility] [phone] office [phone] cell  Patient to arrive at 2pm.

## 2022-05-02 ENCOUNTER — Ambulatory Visit (HOSPITAL_COMMUNITY)
Admission: RE | Admit: 2022-05-02 | Discharge: 2022-05-02 | Disposition: A | Discharge status: Home / Self Care | Payer: BC Managed Care – PPO | Source: Ambulatory Visit | Attending: Cardiology | Admitting: Cardiology

## 2022-05-02 DIAGNOSIS — R072 Precordial pain: Secondary | ICD-10-CM | POA: Insufficient documentation

## 2022-05-02 MED ORDER — NITROGLYCERIN 0.4 MG SL SUBL
0.8000 mg | SUBLINGUAL_TABLET | Freq: Once | SUBLINGUAL | Status: AC
  Administered 2022-05-02: 0.8 mg via SUBLINGUAL

## 2022-05-02 MED ORDER — NITROGLYCERIN 0.4 MG SL SUBL
SUBLINGUAL_TABLET | SUBLINGUAL | Status: AC
  Filled 2022-05-02: qty 2

## 2022-05-02 MED ORDER — IOHEXOL 350 MG/ML SOLN
95.0000 mL | Freq: Once | INTRAVENOUS | Status: AC | PRN
  Administered 2022-05-02: 95 mL via INTRAVENOUS

## 2022-05-11 ENCOUNTER — Telehealth: Payer: Self-pay

## 2022-05-11 NOTE — Telephone Encounter (Signed)
Pt viewed normal results in My Chart. Routed to PCP.

## 2022-05-19 ENCOUNTER — Ambulatory Visit: Payer: BC Managed Care – PPO | Admitting: Family Medicine

## 2022-05-19 VITALS — BP 125/72 | HR 72 | Temp 98.3°F | Wt 171.6 lb

## 2022-05-19 DIAGNOSIS — L509 Urticaria, unspecified: Secondary | ICD-10-CM

## 2022-05-19 LAB — TSH: TSH: 1.02 u[IU]/mL (ref 0.35–5.50)

## 2022-05-19 MED ORDER — LEVOCETIRIZINE DIHYDROCHLORIDE 5 MG PO TABS: 5.0000 mg | ORAL_TABLET | Freq: Every evening | ORAL | 1 refills | Status: DC

## 2022-05-19 MED ORDER — HYDROXYZINE HCL 10 MG PO TABS: 10.0000 mg | ORAL_TABLET | Freq: Three times a day (TID) | ORAL | 1 refills | Status: DC | PRN

## 2022-05-19 NOTE — Patient Instructions (Addendum)
Return in about 4 weeks (around 06/16/2022), or if symptoms worsen or fail to improve.        Great to see you today.  I have refilled the medication(s) we provide.   If labs were collected, we will inform you of lab results once received either by echart message or telephone call.   - echart message- for normal results that have been seen by the patient already.   - telephone call: abnormal results or if patient has not viewed results in their echart.

## 2022-05-19 NOTE — Progress Notes (Signed)
Craig Burton , 1975/12/09, 47 y.o., male MRN: [id number] Patient Care Team    Relationship Specialty Notifications Start End  Ma Hillock, DO PCP - General Family Medicine  12/29/15   Armbruster, Carlota Raspberry, MD Consulting Physician Gastroenterology  02/03/22     Chief Complaint  Patient presents with   Urticaria    3 weeks on and off; no changes in diet soaps or detergent; waist arms back side os legs and lips some times. Starts off looking like mosquito bites     Subjective: Pt presents for an OV with complaints of hives over the last 3 weeks intermittently.  He does not endorse changing any soaps, detergents, personal care products etc.  He states the hives are present when he wakes up and are mostly on his waist, trunk and upper legs.  He has noticed on 2 occasions his lip became mildly swollen. He has not changed his diet or introduced any new medications or over-the-counter supplements. He does admit to being under stress lately.  He had a cardiac CT which resulted with significant cardiac disease.  He also recently lost a close family member.     02/03/2022    8:27 AM 02/03/2021    8:29 AM 02/04/2020   11:15 AM 01/29/2019   11:33 AM 01/12/2018    2:27 PM  Depression screen PHQ 2/9  Decreased Interest 0 0 0 0 0  Down, Depressed, Hopeless 0 0 0 0 0  PHQ - 2 Score 0 0 0 0 0  Altered sleeping 0      Tired, decreased energy 0      Change in appetite 0      Feeling bad or failure about yourself  0      Trouble concentrating 0      Moving slowly or fidgety/restless 0      Suicidal thoughts 0      PHQ-9 Score 0        Allergies  Allergen Reactions   Penicillins Hives   Social History   Social History Narrative   Married to [place]). 2 Children (Will and [name]).    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    Feels safe in his relationships.    Past Medical History:   Diagnosis Date   Allergy    Chicken pox    Hordeolum externum of right eye    Hyperlipidemia    Raynaud's syndrome 2018   fingers, very quick recovery, occurs in winter.   Sebaceous cyst 02/03/2021   Past Surgical History:  Procedure Laterality Date   VASECTOMY  2011   WISDOM TOOTH EXTRACTION     Family History  Problem Relation Age of Onset   Arthritis Mother    Diabetes Mother    Heart disease Mother        CABG   Prostate cancer Father 85       died at 67 from prostate cancer   Heart disease Father        MI- 8   Glaucoma Father    Arthritis Maternal Grandmother    Diabetes Maternal Grandmother    Stroke Maternal Grandmother        90   Glaucoma Maternal Grandmother    Diabetes Maternal Grandfather    Colon cancer Neg Hx    Esophageal cancer Neg Hx    Rectal cancer Neg  Hx    Stomach cancer Neg Hx    Allergies as of 05/19/2022       Reactions   Penicillins Hives        Medication List        Accurate as of May 19, 2022 11:59 PM. If you have any questions, ask your nurse or doctor.          STOP taking these medications    metoprolol tartrate 50 MG tablet Commonly known as: LOPRESSOR Stopped by: Howard Pouch, DO       TAKE these medications    atorvastatin 20 MG tablet Commonly known as: LIPITOR Take 1 tablet (20 mg total) by mouth daily.   hydrOXYzine 10 MG tablet Commonly known as: ATARAX Take 1-3 tablets (10-30 mg total) by mouth 3 (three) times daily as needed. Started by: Howard Pouch, DO   levocetirizine 5 MG tablet Commonly known as: XYZAL Take 1 tablet (5 mg total) by mouth every evening. Started by: Howard Pouch, DO        All past medical history, surgical history, allergies, family history, immunizations andmedications were updated in the EMR today and reviewed under the history and medication portions of their EMR.     ROS Negative, with the exception of above mentioned in HPI   Objective:  BP 125/72   Pulse  72   Temp 98.3 F (36.8 C)   Wt 171 lb 9.6 oz (77.8 kg)   SpO2 100%   BMI 23.93 kg/m  Body mass index is 23.93 kg/m. Physical Exam Vitals and nursing note reviewed.  Constitutional:      General: He is not in acute distress.    Appearance: Normal appearance. He is not ill-appearing, toxic-appearing or diaphoretic.  HENT:     Head: Normocephalic and atraumatic.  Eyes:     General: No scleral icterus.       Right eye: No discharge.        Left eye: No discharge.     Extraocular Movements: Extraocular movements intact.     Pupils: Pupils are equal, round, and reactive to light.  Skin:    General: Skin is warm and dry.     Coloration: Skin is not jaundiced or pale.     Findings: Rash present.  Neurological:     Mental Status: He is alert and oriented to person, place, and time. Mental status is at baseline.  Psychiatric:        Mood and Affect: Mood normal.        Behavior: Behavior normal.        Thought Content: Thought content normal.        Judgment: Judgment normal.      No results found. No results found. No results found for this or any previous visit (from the past 24 hour(s)).  Assessment/Plan: Craig Burton is a 47 y.o. male present for OV for  Urticaria Suspect his hives are likely secondary to recent stressors.  Does not seem to be any other identifiable cause by HPI. We discussed hives and chronic hives today in detail. Start Xyzal 5 mg nightly Start Atarax 10-30 mg 3 times daily as needed Could consider adding Pepcid AC twice daily - Ambulatory referral to Allergy - TSH Follow-up 2-4 weeks as needed  Reviewed expectations re: course of current medical issues. Discussed self-management of symptoms. Outlined signs and symptoms indicating need for more acute intervention. Patient verbalized understanding and all questions were answered. Patient received an After-Visit Summary.  Orders Placed This Encounter  Procedures   TSH   Ambulatory referral  to Allergy   Meds ordered this encounter  Medications   levocetirizine (XYZAL) 5 MG tablet    Sig: Take 1 tablet (5 mg total) by mouth every evening.    Dispense:  90 tablet    Refill:  1   hydrOXYzine (ATARAX) 10 MG tablet    Sig: Take 1-3 tablets (10-30 mg total) by mouth 3 (three) times daily as needed.    Dispense:  90 tablet    Refill:  1   Referral Orders         Ambulatory referral to Allergy       Note is dictated utilizing voice recognition software. Although note has been proof read prior to signing, occasional typographical errors still can be missed. If any questions arise, please do not hesitate to call for verification.   electronically signed by:  Howard Pouch, DO  [place]

## 2022-05-23 ENCOUNTER — Encounter: Payer: Self-pay | Admitting: Family Medicine

## 2022-07-29 ENCOUNTER — Encounter: Payer: Self-pay | Admitting: Cardiology

## 2022-07-29 ENCOUNTER — Ambulatory Visit: Payer: BC Managed Care – PPO | Attending: Cardiology | Admitting: Cardiology

## 2022-07-29 VITALS — BP 128/70 | HR 75 | Ht 71.0 in | Wt 171.1 lb

## 2022-07-29 DIAGNOSIS — Z79899 Other long term (current) drug therapy: Secondary | ICD-10-CM

## 2022-07-29 DIAGNOSIS — R931 Abnormal findings on diagnostic imaging of heart and coronary circulation: Secondary | ICD-10-CM | POA: Diagnosis not present

## 2022-07-29 DIAGNOSIS — I2510 Atherosclerotic heart disease of native coronary artery without angina pectoris: Secondary | ICD-10-CM | POA: Diagnosis not present

## 2022-07-29 NOTE — Progress Notes (Addendum)
Cardiology Office Note:    Date:  07/29/2022   ID:  Mertha Finders, DOB 06-18-1975, MRN [id number]  PCP:  Natalia Leatherwood, DO  Cardiologist:  Gypsy Balsam, MD    Referring MD: Natalia Leatherwood, DO   Chief Complaint  Patient presents with   Follow-up    History of Present Illness:    Craig Burton is a 47 y.o. male who was referred to [place] originally because of elevated calcium score.  He did have some atypical chest pain we ended up doing coronary CT angio which showed only minimal stenosis of the LAD.  He comes today to my office to discuss this issue I do some problem include family history of coronary artery disease not premature however, dyslipidemia.  He is overall doing well.  He tried to run twice a week but at the same time he got some difficulty managing his time in sometimes go to gym.  Denies have any chest pain tightness squeezing pressure burning chest.  Past Medical History:  Diagnosis Date   Allergy    Chicken pox    Hordeolum externum of right eye    Hyperlipidemia    Raynaud's syndrome 2018   fingers, very quick recovery, occurs in winter.   Sebaceous cyst 02/03/2021    Past Surgical History:  Procedure Laterality Date   VASECTOMY  2011   WISDOM TOOTH EXTRACTION      Current Medications: Current Meds  Medication Sig   atorvastatin (LIPITOR) 20 MG tablet Take 1 tablet (20 mg total) by mouth daily.   hydrOXYzine (ATARAX) 10 MG tablet Take 1-3 tablets (10-30 mg total) by mouth 3 (three) times daily as needed. (Patient taking differently: Take 10-30 mg by mouth 3 (three) times daily as needed for itching or anxiety.)   levocetirizine (XYZAL) 5 MG tablet Take 1 tablet (5 mg total) by mouth every evening.     Allergies:   Penicillins   Social History   Socioeconomic History   Marital status: Married    Spouse name: Not on file   Number of children: Not on file   Years of education: Not on file   Highest education level: Master's degree (e.g., MA, MS, MEng,  MEd, MSW, MBA)  Occupational History   Not on file  Tobacco Use   Smoking status: Never   Smokeless tobacco: Never  Vaping Use   Vaping Use: Never used  Substance and Sexual Activity   Alcohol use: Yes    Alcohol/week: 7.0 standard drinks of alcohol    Types: 7 Cans of beer per week    Comment: daily 1-2 drinks    Drug use: No   Sexual activity: Yes    Birth control/protection: None  Other Topics Concern   Not on file  Social History Narrative   Married to [place]). 2 Children (Will and [name]).    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    Feels safe in his relationships.    Social Determinants of Health   Financial Resource Strain: Low Risk  (05/19/2022)   Overall Financial Resource Strain (CARDIA)    Difficulty of Paying Living Expenses: Not hard at all  Food Insecurity: No Food Insecurity (05/19/2022)   Hunger Vital Sign    Worried About Running Out of Food in the Last Year: Never true    Ran Out of Food in the Last Year: Never true  Transportation  Needs: No Transportation Needs (05/19/2022)   PRAPARE - Administrator, Civil Service (Medical): No    Lack of Transportation (Non-Medical): No  Physical Activity: Sufficiently Active (05/19/2022)   Exercise Vital Sign    Days of Exercise per Week: 4 days    Minutes of Exercise per Session: 40 min  Stress: No Stress Concern Present (05/19/2022)   [employer] of Occupational Health - Occupational Stress Questionnaire    Feeling of Stress : Not at all  Social Connections: Socially Integrated (05/19/2022)   Social Connection and Isolation Panel [NHANES]    Frequency of Communication with Friends and Family: More than three times a week    Frequency of Social Gatherings with Friends and Family: Once a week    Attends Religious Services: 1 to 4 times per year    Active Member of [employer] or Organizations: Yes    Attends Museum/gallery exhibitions officer: More than 4 times per year    Marital Status: Married     Family History: The patient's family history includes Arthritis in his maternal grandmother and mother; Diabetes in his maternal grandfather, maternal grandmother, and mother; Glaucoma in his father and maternal grandmother; Heart disease in his father and mother; Prostate cancer (age of onset: 16) in his father; Stroke in his maternal grandmother. There is no history of Colon cancer, Esophageal cancer, Rectal cancer, or Stomach cancer. ROS:   Please see the history of present illness.    All 14 point review of systems negative except as described per history of present illness  EKGs/Labs/Other Studies Reviewed:      Recent Labs: 02/03/2022: ALT 18; BUN 18; Creatinine, Ser 0.85; Hemoglobin 14.4; Platelets 236.0; Potassium 4.6; Sodium 139 05/19/2022: TSH 1.02  Recent Lipid Panel    Component Value Date/Time   CHOL 149 02/03/2022 0831   TRIG 53.0 02/03/2022 0831   HDL 52.40 02/03/2022 0831   CHOLHDL 3 02/03/2022 0831   VLDL 10.6 02/03/2022 0831   LDLCALC 86 02/03/2022 0831    Physical Exam:    VS:  BP 128/70 (BP Location: Left Arm, Patient Position: Sitting)   Pulse 75   Ht  (1.803 m)   Wt 171 lb 1.3 oz (77.6 kg)   SpO2 97%   BMI 23.86 kg/m     Wt Readings from Last 3 Encounters:  07/29/22 171 lb 1.3 oz (77.6 kg)  05/19/22 171 lb 9.6 oz (77.8 kg)  04/26/22 172 lb 1.9 oz (78.1 kg)     GEN:  Well nourished, well developed in no acute distress HEENT: Normal NECK: No JVD; No carotid bruits LYMPHATICS: No lymphadenopathy CARDIAC: RRR, no murmurs, no rubs, no gallops RESPIRATORY:  Clear to auscultation without rales, wheezing or rhonchi  ABDOMEN: Soft, non-tender, non-distended MUSCULOSKELETAL:  No edema; No deformity  SKIN: Warm and dry LOWER EXTREMITIES: no swelling NEUROLOGIC:  Alert and oriented x 3 PSYCHIATRIC:  Normal affect   ASSESSMENT:    1. Coronary artery disease  involving native coronary artery of native heart without angina pectoris   2. Agatston coronary artery calcium score less than 100-92nd percentile   3. On statin therapy    PLAN:    In order of problems listed above:  Coronary disease early stages of atherosclerosis however he is young therefore we aggressively trying to manage it.  He is taking 1 baby aspirin every single day which is fine with me, he is also on statin I did review his K PN which show me  his LDL 86 HDL 52.  This is from October of last year this is on 20 mg of Lipitor.  Ask him to be more rigid about his exercises were discussed again need to use a Mediterranean diet.  Will recheck his fasting lipid profile within next few months. We did talk about healthy lifestyle need to exercise on the regular basis I recommended 5 times a week 30 minutes moderate intensity exercise.  Also we did talk about Mediterranean diet.  Did you add a medication? No  If yes, how many? Number of meds added: 1  If no, reason? Reason for not adding med: Other patient already on good medical therapy, chart include aspirin, statin, will exercise will recheck fasting lipid profile and then if LDL is more than 70 we will increase his statin.  Did you remove a medication? No  Did you increase the dosage of any medication? No  Did you decrease the dosage of any medication? No  Did you refer to a specialist (i.e. lipid clinic, preventive cardiology, endocrinology)? No  Has patient seen plaque report? No      Medication Adjustments/Labs and Tests Ordered: Current medicines are reviewed at length with the patient today.  Concerns regarding medicines are outlined above.  No orders of the defined types were placed in this encounter.  Medication changes: No orders of the defined types were placed in this encounter.   Signed, Georgeanna Lea, MD, [facility] 07/29/2022 9:10 AM    [facility]

## 2022-07-29 NOTE — Patient Instructions (Addendum)
Medication Instructions:  Your physician recommends that you continue on your current medications as directed. Please refer to the Current Medication list given to you today.  *If you need a refill on your cardiac medications before your next appointment, please call your pharmacy*   Lab Work: 3rd Floor    [address]   Your physician recommends that you return for lab work in:   when you are fasting  You need to have labs done when you are fasting.  You can come Monday through Friday 8:00 am to 11:30 am and 1:00 to 4:30. You do not need to make an appointment as the order has already been placed. The labs you are going to have done are Lipid panel     Testing/Procedures: None Ordered   Follow-Up: At [facility], you and your health needs are our priority.  As part of our continuing mission to provide you with exceptional heart care, we have created designated Provider Care Teams.  These Care Teams include your primary Cardiologist (physician) and Advanced Practice Providers (APPs -  Physician Assistants and Nurse Practitioners) who all work together to provide you with the care you need, when you need it.  We recommend signing up for the patient portal called "MyChart".  Sign up information is provided on this After Visit Summary.  MyChart is used to connect with patients for Virtual Visits (Telemedicine).  Patients are able to view lab/test results, encounter notes, upcoming appointments, etc.  Non-urgent messages can be sent to your provider as well.   To learn more about what you can do with MyChart, go to [email or web address].    Your next appointment:   12 month(s)  The format for your next appointment:   In Person  Provider:   Gypsy Balsam, MD    Other Instructions NA

## 2022-07-29 NOTE — Addendum Note (Signed)
Addended by: Baldo Ash D on: 07/29/2022 09:17 AM   Modules accepted: Orders

## 2022-08-12 ENCOUNTER — Encounter: Payer: Self-pay | Admitting: *Deleted

## 2022-08-12 DIAGNOSIS — Z006 Encounter for examination for normal comparison and control in clinical research program: Secondary | ICD-10-CM

## 2022-08-12 NOTE — Research (Signed)
Message left for Mr Fanjoy about V1P research. Encourage him to call me if he would like more info.

## 2022-09-13 DIAGNOSIS — L578 Other skin changes due to chronic exposure to nonionizing radiation: Secondary | ICD-10-CM | POA: Diagnosis not present

## 2022-09-13 DIAGNOSIS — D1801 Hemangioma of skin and subcutaneous tissue: Secondary | ICD-10-CM | POA: Diagnosis not present

## 2022-09-13 DIAGNOSIS — L814 Other melanin hyperpigmentation: Secondary | ICD-10-CM | POA: Diagnosis not present

## 2022-09-13 DIAGNOSIS — L821 Other seborrheic keratosis: Secondary | ICD-10-CM | POA: Diagnosis not present

## 2022-09-30 ENCOUNTER — Encounter: Payer: Self-pay | Admitting: Family Medicine

## 2023-02-06 ENCOUNTER — Ambulatory Visit (INDEPENDENT_AMBULATORY_CARE_PROVIDER_SITE_OTHER): Payer: BC Managed Care – PPO | Admitting: Family Medicine

## 2023-02-06 ENCOUNTER — Encounter: Payer: Self-pay | Admitting: Family Medicine

## 2023-02-06 VITALS — BP 118/80 | HR 73 | Temp 97.7°F | Ht 71.26 in | Wt 168.2 lb

## 2023-02-06 DIAGNOSIS — Z79899 Other long term (current) drug therapy: Secondary | ICD-10-CM | POA: Diagnosis not present

## 2023-02-06 DIAGNOSIS — Z2821 Immunization not carried out because of patient refusal: Secondary | ICD-10-CM | POA: Diagnosis not present

## 2023-02-06 DIAGNOSIS — R931 Abnormal findings on diagnostic imaging of heart and coronary circulation: Secondary | ICD-10-CM

## 2023-02-06 DIAGNOSIS — E782 Mixed hyperlipidemia: Secondary | ICD-10-CM | POA: Diagnosis not present

## 2023-02-06 DIAGNOSIS — Z8042 Family history of malignant neoplasm of prostate: Secondary | ICD-10-CM

## 2023-02-06 DIAGNOSIS — Z131 Encounter for screening for diabetes mellitus: Secondary | ICD-10-CM

## 2023-02-06 DIAGNOSIS — Z125 Encounter for screening for malignant neoplasm of prostate: Secondary | ICD-10-CM

## 2023-02-06 DIAGNOSIS — Z0000 Encounter for general adult medical examination without abnormal findings: Secondary | ICD-10-CM

## 2023-02-06 DIAGNOSIS — Z8249 Family history of ischemic heart disease and other diseases of the circulatory system: Secondary | ICD-10-CM

## 2023-02-06 DIAGNOSIS — I2510 Atherosclerotic heart disease of native coronary artery without angina pectoris: Secondary | ICD-10-CM

## 2023-02-06 LAB — CBC WITH DIFFERENTIAL/PLATELET
Basophils Absolute: 0 10*3/uL (ref 0.0–0.1)
Basophils Relative: 0.8 % (ref 0.0–3.0)
Eosinophils Absolute: 0.1 10*3/uL (ref 0.0–0.7)
Eosinophils Relative: 2.8 % (ref 0.0–5.0)
HCT: 46 % (ref 39.0–52.0)
Hemoglobin: 14.8 g/dL (ref 13.0–17.0)
Lymphocytes Relative: 39.4 % (ref 12.0–46.0)
Lymphs Abs: 2.1 10*3/uL (ref 0.7–4.0)
MCHC: 32.1 g/dL (ref 30.0–36.0)
MCV: 99.1 fL (ref 78.0–100.0)
Monocytes Absolute: 0.5 10*3/uL (ref 0.1–1.0)
Monocytes Relative: 10.2 % (ref 3.0–12.0)
Neutro Abs: 2.5 10*3/uL (ref 1.4–7.7)
Neutrophils Relative %: 46.8 % (ref 43.0–77.0)
Platelets: 241 10*3/uL (ref 150.0–400.0)
RBC: 4.64 Mil/uL (ref 4.22–5.81)
RDW: 13.1 % (ref 11.5–15.5)
WBC: 5.4 10*3/uL (ref 4.0–10.5)

## 2023-02-06 LAB — COMPREHENSIVE METABOLIC PANEL
ALT: 31 U/L (ref 0–53)
AST: 29 U/L (ref 0–37)
Albumin: 4.7 g/dL (ref 3.5–5.2)
Alkaline Phosphatase: 49 U/L (ref 39–117)
BUN: 14 mg/dL (ref 6–23)
CO2: 31 meq/L (ref 19–32)
Calcium: 10 mg/dL (ref 8.4–10.5)
Chloride: 101 meq/L (ref 96–112)
Creatinine, Ser: 0.81 mg/dL (ref 0.40–1.50)
GFR: 104.62 mL/min (ref >60.00)
Glucose, Bld: 91 mg/dL (ref 70–99)
Potassium: 4.4 meq/L (ref 3.5–5.1)
Sodium: 140 meq/L (ref 135–145)
Total Bilirubin: 0.7 mg/dL (ref 0.2–1.2)
Total Protein: 7.4 g/dL (ref 6.0–8.3)

## 2023-02-06 LAB — LIPID PANEL
Cholesterol: 180 mg/dL (ref 0–200)
HDL: 65.1 mg/dL (ref >39.00)
LDL Cholesterol: 101 mg/dL — ABNORMAL HIGH (ref 0–99)
NonHDL: 114.48
Total CHOL/HDL Ratio: 3
Triglycerides: 65 mg/dL (ref 0.0–149.0)
VLDL: 13 mg/dL (ref 0.0–40.0)

## 2023-02-06 LAB — PSA: PSA: 0.84 ng/mL (ref 0.10–4.00)

## 2023-02-06 LAB — HEMOGLOBIN A1C: Hgb A1c MFr Bld: 5.4 % (ref 4.6–6.5)

## 2023-02-06 LAB — TSH: TSH: 1.3 u[IU]/mL (ref 0.35–5.50)

## 2023-02-06 MED ORDER — ATORVASTATIN CALCIUM 20 MG PO TABS: 20.0000 mg | ORAL_TABLET | Freq: Every day | ORAL | 3 refills | Status: DC

## 2023-02-06 MED ORDER — HYDROXYZINE HCL 10 MG PO TABS: 10.0000 mg | ORAL_TABLET | Freq: Three times a day (TID) | ORAL | 3 refills | Status: AC | PRN

## 2023-02-06 NOTE — Progress Notes (Signed)
Patient ID: Craig Burton, male  DOB: [birth year], 47 y.o.   MRN: [id number] Patient Care Team    Relationship Specialty Notifications Start End  Natalia Leatherwood, DO PCP - General Family Medicine  12/29/15   Armbruster, Willaim Rayas, MD Consulting Physician Gastroenterology  02/03/22     Chief Complaint  Patient presents with   Annual Exam    Pt is fasting    Subjective: Craig Burton is a 46 y.o. male present for CPE and chronic condition management All past medical history, surgical history, allergies, family history, immunizations, medications and social history were updated in the electronic medical record today. All recent labs, ED visits and hospitalizations within the last year were reviewed.  Health maintenance:  Colonoscopy: No fhx .04/2020> 10 yrs (Dr. Adela Lank) Immunizations: tdap UTD 2022, Influenza declined (encouraged yearly), covid series completed.  Infectious disease screening: HIV declined, hep C declined PSA:  Father fhx (80s). No sx.  Lab Results  Component Value Date   PSA 0.66 02/03/2022   PSA 0.57 02/03/2021   PSA 0.79 02/04/2020  , pt was counseled on prostate cancer screenings.       02/06/2023    8:17 AM 02/03/2022    8:27 AM 02/03/2021    8:29 AM 02/04/2020   11:15 AM 01/29/2019   11:33 AM  Depression screen PHQ 2/9  Decreased Interest 0 0 0 0 0  Down, Depressed, Hopeless 0 0 0 0 0  PHQ - 2 Score 0 0 0 0 0  Altered sleeping  0     Tired, decreased energy  0     Change in appetite  0     Feeling bad or failure about yourself   0     Trouble concentrating  0     Moving slowly or fidgety/restless  0     Suicidal thoughts  0     PHQ-9 Score  0         02/03/2022    8:32 AM  GAD 7 : Generalized Anxiety Score  Nervous, Anxious, on Edge 0  Control/stop worrying 0  Worry too much - different things 0  Trouble relaxing 0  Restless 0  Easily annoyed or irritable 0  Afraid - awful might happen 0  Total GAD 7 Score 0             02/06/2023    8:17 AM 02/03/2022    8:27 AM 11/29/2017    5:06 PM 12/29/2015    9:21 AM  Fall Risk   Falls in the past year? 0 0 No No  Number falls in past yr: 0 0    Injury with Fall? 0 0    Risk for fall due to : No Fall Risks     Follow up Falls evaluation completed Falls evaluation completed        Immunization History  Administered Date(s) Administered   PFIZER(Purple Top)SARS-COV-2 Vaccination 06/19/2019, 07/15/2019, 03/14/2020   Pfizer Covid-19 Vaccine Bivalent Booster 66yrs & up 04/22/2021   Tdap 04/12/2011, 02/03/2021     Past Medical History:  Diagnosis Date   Allergy    Chicken pox    Hordeolum externum of right eye    Hyperlipidemia    Raynaud's syndrome 2018   fingers, very quick recovery, occurs in winter.   Sebaceous cyst 02/03/2021   Allergies  Allergen Reactions   Penicillins Hives   Past Surgical History:  Procedure Laterality Date   VASECTOMY  2011   WISDOM TOOTH  EXTRACTION     Family History  Problem Relation Age of Onset   Arthritis Mother    Diabetes Mother    Heart disease Mother        CABG   Prostate cancer Father 57       died at 60 from prostate cancer   Heart disease Father        MI- 74   Glaucoma Father    Arthritis Maternal Grandmother    Diabetes Maternal Grandmother    Stroke Maternal Grandmother        51   Glaucoma Maternal Grandmother    Diabetes Maternal Grandfather    Colon cancer Neg Hx    Esophageal cancer Neg Hx    Rectal cancer Neg Hx    Stomach cancer Neg Hx    Social History   Social History Narrative   Married to [place]). 2 Children (Will and [name]).    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    Feels safe in his relationships.     Allergies as of 02/06/2023       Reactions   Penicillins Hives        Medication List        Accurate as of February 06, 2023  8:32 AM. If you have any questions, ask your  nurse or doctor.          STOP taking these medications    levocetirizine 5 MG tablet Commonly known as: XYZAL Stopped by: Felix Pacini       TAKE these medications    atorvastatin 20 MG tablet Commonly known as: LIPITOR Take 1 tablet (20 mg total) by mouth daily.   hydrOXYzine 10 MG tablet Commonly known as: ATARAX Take 1-3 tablets (10-30 mg total) by mouth 3 (three) times daily as needed. What changed: reasons to take this       All past medical history, surgical history, allergies, family history, immunizations andmedications were updated in the EMR today and reviewed under the history and medication portions of their EMR.     No results found for this or any previous visit (from the past 2160 hour(s)).   ROS 14 pt review of systems performed and negative (unless mentioned in an HPI)  Objective: BP 118/80   Pulse 73   Temp 97.7 F (36.5 C)   Ht 5' 11.26" (1.81 m)   Wt 168 lb 3.2 oz (76.3 kg)   SpO2 97%   BMI 23.29 kg/m  Physical Exam Constitutional:      General: He is not in acute distress.    Appearance: Normal appearance. He is not ill-appearing, toxic-appearing or diaphoretic.  HENT:     Head: Normocephalic and atraumatic.     Right Ear: Tympanic membrane, ear canal and external ear normal. There is no impacted cerumen.     Left Ear: Tympanic membrane, ear canal and external ear normal. There is no impacted cerumen.     Nose: Nose normal. No congestion or rhinorrhea.     Mouth/Throat:     Mouth: Mucous membranes are moist.     Pharynx: Oropharynx is clear. No oropharyngeal exudate or posterior oropharyngeal erythema.  Eyes:     General: No scleral icterus.       Right eye: No discharge.        Left eye: No discharge.     Extraocular Movements: Extraocular movements intact.  Pupils: Pupils are equal, round, and reactive to light.  Cardiovascular:     Rate and Rhythm: Normal rate and regular rhythm.     Pulses: Normal pulses.     Heart  sounds: Normal heart sounds. No murmur heard.    No friction rub. No gallop.  Pulmonary:     Effort: Pulmonary effort is normal. No respiratory distress.     Breath sounds: Normal breath sounds. No stridor. No wheezing, rhonchi or rales.  Chest:     Chest wall: No tenderness.  Abdominal:     General: Abdomen is flat. Bowel sounds are normal. There is no distension.     Palpations: Abdomen is soft. There is no mass.     Tenderness: There is no abdominal tenderness. There is no right CVA tenderness, left CVA tenderness, guarding or rebound.     Hernia: No hernia is present.  Musculoskeletal:        General: No swelling or tenderness. Normal range of motion.     Cervical back: Normal range of motion and neck supple.     Right lower leg: No edema.     Left lower leg: No edema.  Lymphadenopathy:     Cervical: No cervical adenopathy.  Skin:    General: Skin is warm and dry.     Coloration: Skin is not jaundiced.     Findings: No bruising, lesion or rash.  Neurological:     General: No focal deficit present.     Mental Status: He is alert and oriented to person, place, and time. Mental status is at baseline.     Cranial Nerves: No cranial nerve deficit.     Sensory: No sensory deficit.     Motor: No weakness.     Coordination: Coordination normal.     Gait: Gait normal.     Deep Tendon Reflexes: Reflexes normal.  Psychiatric:        Mood and Affect: Mood normal.        Behavior: Behavior normal.        Thought Content: Thought content normal.        Judgment: Judgment normal.     No results found.  Assessment/plan: Kortney Cantera is a 47 y.o. male present for CPE Mixed hyperlipidemia/Encounter for long-term current use of medication/fhx HD Continue statin -Coronary calcium score 100-92nd % -Lipids collected today  FH: prostate cancer/Prostate cancer screening PSA collected today  Encounter for preventive health examination Patient was encouraged to exercise greater than 150  minutes a week. Patient was encouraged to choose a diet filled with fresh fruits and vegetables, and lean meats. AVS provided to patient today for education/recommendation on gender specific health and safety maintenance. Colonoscopy: No fhx .04/2020> 10 yrs (Dr. Adela Lank) Immunizations: tdap UTD 2022, Influenza declined (encouraged yearly), covid series completed.  Infectious disease screening: HIV declined, hep Cdeclined PSA:  Father fhx (80s). No sx.   Return in about 1 year (around 02/07/2024) for cpe (20 min).  Orders Placed This Encounter  Procedures   CBC with Differential/Platelet   Hemoglobin A1c   Comprehensive metabolic panel   Lipid panel   TSH   PSA    Meds ordered this encounter  Medications   hydrOXYzine (ATARAX) 10 MG tablet    Sig: Take 1-3 tablets (10-30 mg total) by mouth 3 (three) times daily as needed.    Dispense:  90 tablet    Refill:  3   atorvastatin (LIPITOR) 20 MG tablet    Sig: Take 1  tablet (20 mg total) by mouth daily.    Dispense:  90 tablet    Refill:  3   Referral Orders  No referral(s) requested today     Note is dictated utilizing voice recognition software. Although note has been proof read prior to signing, occasional typographical errors still can be missed. If any questions arise, please do not hesitate to call for verification.  Electronically signed by: Felix Pacini, DO [facility] [place]

## 2023-02-06 NOTE — Patient Instructions (Addendum)
Return in about 1 year (around 02/07/2024) for cpe (20 min).        Great to see you today.  I have refilled the medication(s) we provide.   If labs were collected or images ordered, we will inform you of  results once we have received them and reviewed. We will contact you either by echart message, or telephone call.  Please give ample time to the testing facility, and our office to run,  receive and review results. Please do not call inquiring of results, even if you can see them in your chart. We will contact you as soon as we are able. If it has been over 1 week since the test was completed, and you have not yet heard from [place], then please call us.    - echart message- for normal results that have been seen by the patient already.   - telephone call: abnormal results or if patient has not viewed results in their echart.  If a referral to a specialist was entered for you, please call us in 2 weeks if you have not heard from the specialist office to schedule.

## 2023-03-01 ENCOUNTER — Other Ambulatory Visit: Payer: Self-pay | Admitting: Family Medicine

## 2023-12-29 ENCOUNTER — Encounter: Payer: Self-pay | Admitting: Family Medicine

## 2024-01-01 ENCOUNTER — Ambulatory Visit (INDEPENDENT_AMBULATORY_CARE_PROVIDER_SITE_OTHER): Admitting: Family Medicine

## 2024-01-01 ENCOUNTER — Encounter: Payer: Self-pay | Admitting: Family Medicine

## 2024-01-01 VITALS — BP 120/80 | HR 76 | Temp 98.1°F | Wt 170.0 lb

## 2024-01-01 DIAGNOSIS — W57XXXA Bitten or stung by nonvenomous insect and other nonvenomous arthropods, initial encounter: Secondary | ICD-10-CM | POA: Diagnosis not present

## 2024-01-01 DIAGNOSIS — S80869A Insect bite (nonvenomous), unspecified lower leg, initial encounter: Secondary | ICD-10-CM | POA: Diagnosis not present

## 2024-01-01 MED ORDER — DOXYCYCLINE HYCLATE 100 MG PO TABS: 100.0000 mg | ORAL_TABLET | Freq: Two times a day (BID) | ORAL | 0 refills | Status: DC

## 2024-01-01 NOTE — Patient Instructions (Addendum)

## 2024-01-01 NOTE — Progress Notes (Signed)
 Craig Burton , Jan 17, 1976, 48 y.o., male MRN: [id number] Patient Care Team    Relationship Specialty Notifications Start End  Catherine Charlies LABOR, DO PCP - General Family Medicine  12/29/15   Armbruster, Elspeth SQUIBB, MD Consulting Physician Gastroenterology  02/03/22     Chief Complaint  Patient presents with   Insect Bite    Lower R leg; raised, discoloration. Denies irritation. Pt [place] area [place], unsure if it was a tick that bit him.      Subjective: Craig Burton is a 48 y.o. Pt presents for an OV with complaints of  of tick bite of the bilateral lower extremities and arms of 10 days duration.  He reports he had mild headache last weekend, felt fatigued with mild congestion and bodyaches.  He was not certain if that was related to the tick bites.  The majority of the tick bites are very small, made by a very small hard to see tick.  There is a location on his right distal thigh that had more of a red raised almost bruised area present.  No rash.  No fever. He does endorse feeling a little better today.     02/06/2023    8:17 AM 02/03/2022    8:27 AM 02/03/2021    8:29 AM 02/04/2020   11:15 AM 01/29/2019   11:33 AM  Depression screen PHQ 2/9  Decreased Interest 0 0 0 0 0  Down, Depressed, Hopeless 0 0 0 0 0  PHQ - 2 Score 0 0 0 0 0  Altered sleeping  0     Tired, decreased energy  0     Change in appetite  0     Feeling bad or failure about yourself   0     Trouble concentrating  0     Moving slowly or fidgety/restless  0     Suicidal thoughts  0     PHQ-9 Score  0       Allergies  Allergen Reactions   Penicillins Hives   Social History   Social History Narrative   Married to [place]). 2 Children (Will and [name]).    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    Feels safe in his relationships.    Past Medical History:  Diagnosis Date   Allergy    Chicken pox     Hordeolum externum of right eye    Hyperlipidemia    Raynaud's syndrome 2018   fingers, very quick recovery, occurs in winter.   Sebaceous cyst 02/03/2021   Past Surgical History:  Procedure Laterality Date   VASECTOMY  2011   WISDOM TOOTH EXTRACTION     Family History  Problem Relation Age of Onset   Arthritis Mother    Diabetes Mother    Heart disease Mother        CABG   Prostate cancer Father 57       died at 41 from prostate cancer   Heart disease Father        MI- 67   Glaucoma Father    Arthritis Maternal Grandmother    Diabetes Maternal Grandmother    Stroke Maternal Grandmother        67   Glaucoma Maternal Grandmother    Diabetes Maternal Grandfather    Colon cancer Neg Hx    Esophageal cancer Neg Hx  Rectal cancer Neg Hx    Stomach cancer Neg Hx    Allergies as of 01/01/2024       Reactions   Penicillins Hives        Medication List        Accurate as of January 01, 2024  1:30 PM. If you have any questions, ask your nurse or doctor.          atorvastatin  20 MG tablet Commonly known as: LIPITOR Take 1 tablet (20 mg total) by mouth daily.   doxycycline  100 MG tablet Commonly known as: VIBRA -TABS Take 1 tablet (100 mg total) by mouth 2 (two) times daily. Started by: Charlies Bellini   hydrOXYzine  10 MG tablet Commonly known as: ATARAX  Take 1-3 tablets (10-30 mg total) by mouth 3 (three) times daily as needed.        All past medical history, surgical history, allergies, family history, immunizations andmedications were updated in the EMR today and reviewed under the history and medication portions of their EMR.     Review of Systems  Constitutional:  Positive for malaise/fatigue. Negative for chills and fever.  HENT:  Positive for congestion and sore throat.   Eyes: Negative.   Respiratory: Negative.    Cardiovascular: Negative.   Gastrointestinal: Negative.   Musculoskeletal:  Positive for myalgias.  Neurological:  Positive for  headaches. Negative for dizziness.   Negative, with the exception of above mentioned in HPI   Objective:  BP 120/80   Pulse 76   Temp 98.1 F (36.7 C)   Wt 170 lb (77.1 kg)   SpO2 98%   BMI 23.54 kg/m  Body mass index is 23.54 kg/m.  Physical Exam Vitals and nursing note reviewed. Exam conducted with a chaperone present.  Constitutional:      General: He is not in acute distress.    Appearance: Normal appearance. He is not ill-appearing, toxic-appearing or diaphoretic.  HENT:     Head: Normocephalic and atraumatic.  Eyes:     General: No scleral icterus.       Right eye: No discharge.        Left eye: No discharge.     Extraocular Movements: Extraocular movements intact.     Pupils: Pupils are equal, round, and reactive to light.  Skin:    General: Skin is warm and dry.     Coloration: Skin is not jaundiced or pale.     Findings: No rash.  Neurological:     Mental Status: He is alert and oriented to person, place, and time. Mental status is at baseline.  Psychiatric:        Mood and Affect: Mood normal.        Behavior: Behavior normal.        Thought Content: Thought content normal.        Judgment: Judgment normal.      No results found. No results found. No results found for this or any previous visit (from the past 24 hours).  Assessment/Plan: Craig Burton is a 48 y.o. male present for OV for  Tick bite of lower leg, unspecified laterality, initial encounter (Primary) Makes symptoms of fatigue, headache myalgias could have been related to the tick bite.  Therefore we will go ahead and move forward with testing and treatment. Discussed collecting titers today to follow-up RMSF or exposure to Lyme.  Treat with doxycycline  twice daily x 10 days while we wait on titers to return. This would also help with any upper respiratory  symptoms if it was becoming a bacterial infection. - doxycycline  (VIBRA -TABS) 100 MG tablet; Take 1 tablet (100 mg total) by mouth 2 (two)  times daily.  Dispense: 20 tablet; Refill: 0 - Rocky mtn spotted fvr abs pnl(IgG+IgM) - B. burgdorfi antibodies Follow-up as needed  Reviewed expectations re: course of current medical issues. Discussed self-management of symptoms. Outlined signs and symptoms indicating need for more acute intervention. Patient verbalized understanding and all questions were answered. Patient received an After-Visit Summary.    Orders Placed This Encounter  Procedures   Rocky mtn spotted fvr abs pnl(IgG+IgM)   B. burgdorfi antibodies   Meds ordered this encounter  Medications   doxycycline  (VIBRA -TABS) 100 MG tablet    Sig: Take 1 tablet (100 mg total) by mouth 2 (two) times daily.    Dispense:  20 tablet    Refill:  0   Referral Orders  No referral(s) requested today     Note is dictated utilizing voice recognition software. Although note has been proof read prior to signing, occasional typographical errors still can be missed. If any questions arise, please do not hesitate to call for verification.   electronically signed by:  Charlies Bellini, DO  [facility] [place]

## 2024-01-03 ENCOUNTER — Ambulatory Visit: Payer: Self-pay | Admitting: Family Medicine

## 2024-01-05 LAB — ROCKY MTN SPOTTED FVR ABS PNL(IGG+IGM)
RMSF IgG: NOT DETECTED
RMSF IgM: NOT DETECTED

## 2024-01-05 LAB — B. BURGDORFI ANTIBODIES: B burgdorferi Ab IgG+IgM: <0.9 {index}

## 2024-01-08 ENCOUNTER — Encounter: Payer: Self-pay | Admitting: Family Medicine

## 2024-01-15 ENCOUNTER — Ambulatory Visit: Admitting: Family Medicine

## 2024-02-09 ENCOUNTER — Ambulatory Visit (INDEPENDENT_AMBULATORY_CARE_PROVIDER_SITE_OTHER): Payer: BC Managed Care – PPO | Admitting: Family Medicine

## 2024-02-09 ENCOUNTER — Encounter: Payer: Self-pay | Admitting: Family Medicine

## 2024-02-09 VITALS — BP 118/76 | HR 68 | Temp 98.2°F | Wt 163.0 lb

## 2024-02-09 DIAGNOSIS — Z125 Encounter for screening for malignant neoplasm of prostate: Secondary | ICD-10-CM

## 2024-02-09 DIAGNOSIS — Z8042 Family history of malignant neoplasm of prostate: Secondary | ICD-10-CM | POA: Diagnosis not present

## 2024-02-09 DIAGNOSIS — Z8249 Family history of ischemic heart disease and other diseases of the circulatory system: Secondary | ICD-10-CM | POA: Diagnosis not present

## 2024-02-09 DIAGNOSIS — E782 Mixed hyperlipidemia: Secondary | ICD-10-CM | POA: Diagnosis not present

## 2024-02-09 DIAGNOSIS — R931 Abnormal findings on diagnostic imaging of heart and coronary circulation: Secondary | ICD-10-CM | POA: Diagnosis not present

## 2024-02-09 DIAGNOSIS — Z131 Encounter for screening for diabetes mellitus: Secondary | ICD-10-CM

## 2024-02-09 DIAGNOSIS — I2510 Atherosclerotic heart disease of native coronary artery without angina pectoris: Secondary | ICD-10-CM | POA: Diagnosis not present

## 2024-02-09 DIAGNOSIS — Z79899 Other long term (current) drug therapy: Secondary | ICD-10-CM | POA: Diagnosis not present

## 2024-02-09 DIAGNOSIS — Z0000 Encounter for general adult medical examination without abnormal findings: Secondary | ICD-10-CM | POA: Diagnosis not present

## 2024-02-09 DIAGNOSIS — Z2821 Immunization not carried out because of patient refusal: Secondary | ICD-10-CM

## 2024-02-09 LAB — LIPID PANEL
Cholesterol: 157 mg/dL (ref 0–200)
HDL: 66.8 mg/dL (ref >39.00)
LDL Cholesterol: 79 mg/dL (ref 0–99)
NonHDL: 90.35
Total CHOL/HDL Ratio: 2
Triglycerides: 57 mg/dL (ref 0.0–149.0)
VLDL: 11.4 mg/dL (ref 0.0–40.0)

## 2024-02-09 LAB — COMPREHENSIVE METABOLIC PANEL WITH GFR
ALT: 18 U/L (ref 0–53)
AST: 19 U/L (ref 0–37)
Albumin: 4.7 g/dL (ref 3.5–5.2)
Alkaline Phosphatase: 50 U/L (ref 39–117)
BUN: 16 mg/dL (ref 6–23)
CO2: 32 meq/L (ref 19–32)
Calcium: 9.7 mg/dL (ref 8.4–10.5)
Chloride: 102 meq/L (ref 96–112)
Creatinine, Ser: 0.83 mg/dL (ref 0.40–1.50)
GFR: 103.12 mL/min (ref >60.00)
Glucose, Bld: 91 mg/dL (ref 70–99)
Potassium: 4.6 meq/L (ref 3.5–5.1)
Sodium: 141 meq/L (ref 135–145)
Total Bilirubin: 1 mg/dL (ref 0.2–1.2)
Total Protein: 7.3 g/dL (ref 6.0–8.3)

## 2024-02-09 LAB — CBC
HCT: 45.4 % (ref 39.0–52.0)
Hemoglobin: 14.8 g/dL (ref 13.0–17.0)
MCHC: 32.6 g/dL (ref 30.0–36.0)
MCV: 97.9 fl (ref 78.0–100.0)
Platelets: 247 K/uL (ref 150.0–400.0)
RBC: 4.64 Mil/uL (ref 4.22–5.81)
RDW: 13.1 % (ref 11.5–15.5)
WBC: 5.4 K/uL (ref 4.0–10.5)

## 2024-02-09 LAB — PSA: PSA: 1.43 ng/mL (ref 0.10–4.00)

## 2024-02-09 LAB — HEMOGLOBIN A1C: Hgb A1c MFr Bld: 5.4 % (ref 4.6–6.5)

## 2024-02-09 LAB — TSH: TSH: 1.46 u[IU]/mL (ref 0.35–5.50)

## 2024-02-09 MED ORDER — ATORVASTATIN CALCIUM 20 MG PO TABS: 20.0000 mg | ORAL_TABLET | Freq: Every day | ORAL | 3 refills | Status: AC

## 2024-02-09 NOTE — Patient Instructions (Addendum)

## 2024-02-09 NOTE — Progress Notes (Signed)
 Patient ID: Craig Burton, male  DOB: 1976/01/28, 48 y.o.   MRN: [id number] Patient Care Team    Relationship Specialty Notifications Start End  Craig Burton LABOR, DO PCP - General Family Medicine  12/29/15   Burton, Craig SQUIBB, MD Consulting Physician Gastroenterology  02/03/22     Chief Complaint  Patient presents with   Annual Exam    Pt is fasting.  Chronic condition management    Subjective: Craig Burton is a 48 y.o. male present for CPE and chronic condition management All past medical history, surgical history, allergies, family history, immunizations, medications and social history were updated in the electronic medical record today. All recent labs, ED visits and hospitalizations within the last year were reviewed.  Health maintenance:  Colonoscopy: No fhx .04/2020> 10 yrs (Dr. Leigh) Immunizations: tdap UTD 2022, Influenza declined encouraged yearly)  Infectious disease screening: HIV declined, hep C completed PSA:  Father fhx (80s). No sx. , pt was counseled on prostate cancer screenings.   Lab Results  Component Value Date   PSA 0.84 02/06/2023   PSA 0.66 02/03/2022   PSA 0.57 02/03/2021        02/09/2024    8:02 AM 02/06/2023    8:17 AM 02/03/2022    8:27 AM 02/03/2021    8:29 AM 02/04/2020   11:15 AM  Depression screen PHQ 2/9  Decreased Interest 0 0 0 0 0  Down, Depressed, Hopeless 0 0 0 0 0  PHQ - 2 Score 0 0 0 0 0  Altered sleeping 0  0    Tired, decreased energy 0  0    Change in appetite 0  0    Feeling bad or failure about yourself  0  0    Trouble concentrating 0  0    Moving slowly or fidgety/restless 0  0    Suicidal thoughts 0  0    PHQ-9 Score 0  0    Difficult doing work/chores Not difficult at all          02/09/2024    8:02 AM 02/03/2022    8:32 AM  GAD 7 : Generalized Anxiety Score  Nervous, Anxious, on Edge 0 0  Control/stop worrying 0 0  Worry too much - different things 0 0  Trouble relaxing 0 0  Restless 0 0   Easily annoyed or irritable 0 0  Afraid - awful might happen 0 0  Total GAD 7 Score 0 0  Anxiety Difficulty Not difficult at all             02/09/2024    8:02 AM 02/06/2023    8:17 AM 02/03/2022    8:27 AM 11/29/2017    5:06 PM 12/29/2015    9:21 AM  Fall Risk   Falls in the past year? 0 0 0 No  No   Number falls in past yr: 0 0 0    Injury with Fall? 0 0 0    Risk for fall due to :  No Fall Risks     Follow up Falls evaluation completed Falls evaluation completed Falls evaluation completed        Data saved with a previous flowsheet row definition      Immunization History  Administered Date(s) Administered   PFIZER(Purple Top)SARS-COV-2 Vaccination 06/19/2019, 07/15/2019, 03/14/2020   Pfizer Covid-19 Vaccine Bivalent Booster 86yrs & up 04/22/2021   Tdap 04/12/2011, 02/03/2021     Past Medical History:  Diagnosis Date   Allergy  Atypical chest pain 04/26/2022   Chicken pox    Hordeolum externum of right eye    Hyperlipidemia    Raynaud's syndrome 2018   fingers, very quick recovery, occurs in winter.   Sebaceous cyst 02/03/2021   Allergies  Allergen Reactions   Penicillins Hives   Past Surgical History:  Procedure Laterality Date   VASECTOMY  2011   WISDOM TOOTH EXTRACTION     Family History  Problem Relation Age of Onset   Arthritis Mother    Diabetes Mother    Heart disease Mother        CABG   Prostate cancer Father 44       died at 68 from prostate cancer   Heart disease Father        MI- 55   Glaucoma Father    Arthritis Maternal Grandmother    Diabetes Maternal Grandmother    Stroke Maternal Grandmother        48   Glaucoma Maternal Grandmother    Diabetes Maternal Grandfather    Colon cancer Neg Hx    Esophageal cancer Neg Hx    Rectal cancer Neg Hx    Stomach cancer Neg Hx    Social History   Social History Narrative   Married to Craig Burton). 2 Children (Craig Burton and Craig Burton).    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    Feels safe in his relationships.     Allergies as of 02/09/2024       Reactions   Penicillins Hives        Medication List        Accurate as of February 09, 2024  8:16 AM. If you have any questions, ask your nurse or doctor.          STOP taking these medications    doxycycline  100 MG tablet Commonly known as: VIBRA -TABS Stopped by: Craig Burton       TAKE these medications    atorvastatin  20 MG tablet Commonly known as: LIPITOR Take 1 tablet (20 mg total) by mouth daily.   hydrOXYzine  10 MG tablet Commonly known as: ATARAX  Take 1-3 tablets (10-30 mg total) by mouth 3 (three) times daily as needed.       All past medical history, surgical history, allergies, family history, immunizations andmedications were updated in the EMR today and reviewed under the history and medication portions of their EMR.       Review of Systems  All other systems reviewed and are negative.  14 pt review of systems performed and negative (unless mentioned in an HPI)  Objective: BP 118/76   Pulse 68   Temp 98.2 F (36.8 C)   Wt 163 lb (73.9 kg)   SpO2 98%   BMI 22.57 kg/m  Physical Exam Constitutional:      General: He is not in acute distress.    Appearance: Normal appearance. He is not ill-appearing, toxic-appearing or diaphoretic.  HENT:     Head: Normocephalic and atraumatic.     Right Ear: Tympanic membrane, ear canal and external ear normal. There is no impacted cerumen.     Left Ear: Tympanic membrane, ear canal and external ear normal. There is no impacted cerumen.     Nose: Nose normal. No congestion or rhinorrhea.     Mouth/Throat:     Mouth: Mucous membranes are moist.     Pharynx: Oropharynx  is clear. No oropharyngeal exudate or posterior oropharyngeal erythema.  Eyes:     General: No scleral icterus.       Right eye: No discharge.        Left eye: No discharge.      Extraocular Movements: Extraocular movements intact.     Pupils: Pupils are equal, round, and reactive to light.  Cardiovascular:     Rate and Rhythm: Normal rate and regular rhythm.     Pulses: Normal pulses.     Heart sounds: Normal heart sounds. No murmur heard.    No friction rub. No gallop.  Pulmonary:     Effort: Pulmonary effort is normal. No respiratory distress.     Breath sounds: Normal breath sounds. No stridor. No wheezing, rhonchi or rales.  Chest:     Chest wall: No tenderness.  Abdominal:     General: Abdomen is flat. Bowel sounds are normal. There is no distension.     Palpations: Abdomen is soft. There is no mass.     Tenderness: There is no abdominal tenderness. There is no right CVA tenderness, left CVA tenderness, guarding or rebound.     Hernia: No hernia is present.  Musculoskeletal:        General: No swelling or tenderness. Normal range of motion.     Cervical back: Normal range of motion and neck supple.     Right lower leg: No edema.     Left lower leg: No edema.  Lymphadenopathy:     Cervical: No cervical adenopathy.  Skin:    General: Skin is warm and dry.     Coloration: Skin is not jaundiced.     Findings: No bruising, lesion or rash.  Neurological:     General: No focal deficit present.     Mental Status: He is alert and oriented to person, place, and time. Mental status is at baseline.     Cranial Nerves: No cranial nerve deficit.     Sensory: No sensory deficit.     Motor: No weakness.     Coordination: Coordination normal.     Gait: Gait normal.     Deep Tendon Reflexes: Reflexes normal.  Psychiatric:        Mood and Affect: Mood normal.        Behavior: Behavior normal.        Thought Content: Thought content normal.        Judgment: Judgment normal.     No results found.  Assessment/plan: Craig Burton is a 48 y.o. male present for CPE and chronic condition management Mixed hyperlipidemia/Encounter for long-term current use of  medication/fhx HD Continue statin -Coronary calcium  score 100-92nd % -Establish cardiology Labs collected today  FH: prostate cancer/Prostate cancer screening PSA collected today  Encounter for preventive health examination Colonoscopy: No fhx .04/2020> 10 yrs (Dr. Leigh) Immunizations: tdap UTD 2022, Influenza declined encouraged yearly), covid series completed.  Infectious disease screening: HIV declined, hep C completed PSA:  Father fhx (80s). No sx. , pt was counseled on prostate cancer screenings.  Patient was encouraged to exercise greater than 150 minutes a week. Patient was encouraged to choose a diet filled with fresh fruits and vegetables, and lean meats. AVS provided to patient today for education/recommendation on gender specific health and safety maintenance.   Return in about 1 year (around 02/09/2025) for cpe (20 min), Routine chronic condition follow-up.  Orders Placed This Encounter  Procedures   CBC   Comprehensive metabolic panel with GFR  Hemoglobin A1c   Lipid panel   PSA   TSH    No orders of the defined types were placed in this encounter.  Referral Orders  No referral(s) requested today     Note is dictated utilizing voice recognition software. Although note has been proof read prior to signing, occasional typographical errors still can be missed. If any questions arise, please do not hesitate to call for verification.  Electronically signed by: Burton Bellini, DO [facility]

## 2024-02-12 ENCOUNTER — Ambulatory Visit: Payer: Self-pay | Admitting: Family Medicine

## 2025-02-11 ENCOUNTER — Encounter: Admitting: Family Medicine
